# Patient Record
Sex: Female | Born: 1955
Health system: Southern US, Community
[De-identification: ages and names within clinical notes are randomized; demographics above are authoritative.]

## PROBLEM LIST (undated history)

## (undated) DIAGNOSIS — R112 Nausea with vomiting, unspecified: Secondary | ICD-10-CM

## (undated) DIAGNOSIS — G4730 Sleep apnea, unspecified: Secondary | ICD-10-CM

## (undated) DIAGNOSIS — E7800 Pure hypercholesterolemia, unspecified: Secondary | ICD-10-CM

## (undated) DIAGNOSIS — M1990 Unspecified osteoarthritis, unspecified site: Secondary | ICD-10-CM

## (undated) DIAGNOSIS — K219 Gastro-esophageal reflux disease without esophagitis: Secondary | ICD-10-CM

## (undated) DIAGNOSIS — Z8669 Personal history of other diseases of the nervous system and sense organs: Secondary | ICD-10-CM

## (undated) DIAGNOSIS — J069 Acute upper respiratory infection, unspecified: Secondary | ICD-10-CM

## (undated) DIAGNOSIS — I10 Essential (primary) hypertension: Secondary | ICD-10-CM

## (undated) DIAGNOSIS — Z8719 Personal history of other diseases of the digestive system: Secondary | ICD-10-CM

## (undated) DIAGNOSIS — R51 Headache: Secondary | ICD-10-CM

## (undated) DIAGNOSIS — C801 Malignant (primary) neoplasm, unspecified: Secondary | ICD-10-CM

## (undated) DIAGNOSIS — I209 Angina pectoris, unspecified: Secondary | ICD-10-CM

## (undated) DIAGNOSIS — Z9889 Other specified postprocedural states: Secondary | ICD-10-CM

## (undated) HISTORY — PX: HAMMER TOE SURGERY: SHX385

## (undated) HISTORY — PX: CHOLECYSTECTOMY: SHX55

## (undated) HISTORY — PX: CARPAL TUNNEL RELEASE: SHX101

## (undated) HISTORY — PX: BUNIONECTOMY: SHX129

---

## 1997-01-30 HISTORY — PX: APPENDECTOMY: SHX54

## 1997-01-30 HISTORY — PX: ABDOMINAL HYSTERECTOMY: SHX81

## 2008-11-05 DIAGNOSIS — I10 Essential (primary) hypertension: Secondary | ICD-10-CM | POA: Insufficient documentation

## 2008-11-05 DIAGNOSIS — E669 Obesity, unspecified: Secondary | ICD-10-CM | POA: Insufficient documentation

## 2008-11-05 DIAGNOSIS — G4733 Obstructive sleep apnea (adult) (pediatric): Secondary | ICD-10-CM | POA: Insufficient documentation

## 2010-07-31 DIAGNOSIS — C801 Malignant (primary) neoplasm, unspecified: Secondary | ICD-10-CM

## 2010-07-31 HISTORY — DX: Malignant (primary) neoplasm, unspecified: C80.1

## 2010-08-29 ENCOUNTER — Other Ambulatory Visit: Payer: Self-pay | Admitting: Obstetrics and Gynecology

## 2010-08-29 DIAGNOSIS — C50912 Malignant neoplasm of unspecified site of left female breast: Secondary | ICD-10-CM

## 2010-08-31 HISTORY — PX: BREAST SURGERY: SHX581

## 2010-09-02 ENCOUNTER — Ambulatory Visit
Admission: RE | Admit: 2010-09-02 | Discharge: 2010-09-02 | Disposition: A | Discharge status: Home / Self Care | Payer: BC Managed Care – PPO | Source: Ambulatory Visit | Attending: Obstetrics and Gynecology | Admitting: Obstetrics and Gynecology

## 2010-09-02 DIAGNOSIS — C50912 Malignant neoplasm of unspecified site of left female breast: Secondary | ICD-10-CM

## 2010-09-02 MED ORDER — GADOBENATE DIMEGLUMINE 529 MG/ML IV SOLN
20.0000 mL | Freq: Once | INTRAVENOUS | Status: AC | PRN
  Administered 2010-09-02: 20 mL via INTRAVENOUS

## 2010-12-01 ENCOUNTER — Encounter (HOSPITAL_COMMUNITY): Payer: Self-pay | Admitting: Respiratory Therapy

## 2010-12-05 ENCOUNTER — Ambulatory Visit (HOSPITAL_COMMUNITY)
Admission: RE | Admit: 2010-12-05 | Discharge: 2010-12-05 | Disposition: A | Discharge status: Home / Self Care | Payer: BC Managed Care – PPO | Source: Ambulatory Visit | Attending: Plastic Surgery | Admitting: Plastic Surgery

## 2010-12-05 ENCOUNTER — Encounter (HOSPITAL_COMMUNITY): Payer: Self-pay

## 2010-12-05 ENCOUNTER — Other Ambulatory Visit: Payer: Self-pay

## 2010-12-05 ENCOUNTER — Encounter (HOSPITAL_COMMUNITY)
Admission: RE | Admit: 2010-12-05 | Discharge: 2010-12-05 | Disposition: A | Discharge status: Home / Self Care | Payer: BC Managed Care – PPO | Source: Ambulatory Visit | Attending: Plastic Surgery | Admitting: Plastic Surgery

## 2010-12-05 DIAGNOSIS — J3489 Other specified disorders of nose and nasal sinuses: Secondary | ICD-10-CM | POA: Insufficient documentation

## 2010-12-05 DIAGNOSIS — I10 Essential (primary) hypertension: Secondary | ICD-10-CM | POA: Insufficient documentation

## 2010-12-05 DIAGNOSIS — Z01812 Encounter for preprocedural laboratory examination: Secondary | ICD-10-CM | POA: Insufficient documentation

## 2010-12-05 DIAGNOSIS — Z01818 Encounter for other preprocedural examination: Secondary | ICD-10-CM | POA: Insufficient documentation

## 2010-12-05 HISTORY — DX: Unspecified osteoarthritis, unspecified site: M19.90

## 2010-12-05 HISTORY — DX: Essential (primary) hypertension: I10

## 2010-12-05 HISTORY — DX: Sleep apnea, unspecified: G47.30

## 2010-12-05 HISTORY — DX: Malignant (primary) neoplasm, unspecified: C80.1

## 2010-12-05 LAB — SURGICAL PCR SCREEN
MRSA, PCR: NEGATIVE
Staphylococcus aureus: NEGATIVE

## 2010-12-05 LAB — BASIC METABOLIC PANEL
Calcium: 9.8 mg/dL (ref 8.4–10.5)
Creatinine, Ser: 0.6 mg/dL (ref 0.50–1.10)
GFR calc Af Amer: >90 mL/min (ref >90)
Sodium: 138 mEq/L (ref 135–145)

## 2010-12-05 LAB — CBC
MCH: 28.7 pg (ref 26.0–34.0)
MCV: 85.7 fL (ref 78.0–100.0)
Platelets: 241 10*3/uL (ref 150–400)
RBC: 4.56 MIL/uL (ref 3.87–5.11)
RDW: 13.5 % (ref 11.5–15.5)
WBC: 7.2 10*3/uL (ref 4.0–10.5)

## 2010-12-05 NOTE — Pre-Procedure Instructions (Signed)
20 Joanna [name]  12/05/2010   Your procedure is scheduled on:  12/13/10  Report to [facility] at 900 AM.  Call this number if you have problems the morning of surgery: [phone]   Remember:   Do not eat food:After Midnight.  Do not drink clear liquids: 4 Hours before arrival.  Take these medicines the morning of surgery with A SIP OF WATER: none   Do not wear jewelry, make-up or nail polish.  Do not wear lotions, powders, or perfumes. You may wear deodorant.  Do not shave 48 hours prior to surgery.  Do not bring valuables to the hospital.  Contacts, dentures or bridgework may not be worn into surgery.  Leave suitcase in the car. After surgery it may be brought to your room.  For patients admitted to the hospital, checkout time is 11:00 AM the day of discharge.   Patients discharged the day of surgery will not be allowed to drive home.  Name and phone number of your driver: family  Special Instructions: CHG Shower Use Special Wash: 1/2 bottle night before surgery and 1/2 bottle morning of surgery.   Please read over the following fact sheets that you were given: MRSA Information

## 2010-12-12 MED ORDER — CEFAZOLIN SODIUM-DEXTROSE 2-3 GM-% IV SOLR
2.0000 g | INTRAVENOUS | Status: AC
  Administered 2010-12-13: 2 g via INTRAVENOUS
  Administered 2010-12-13: 1 g via INTRAVENOUS
  Filled 2010-12-12: qty 50

## 2010-12-12 MED ORDER — ENOXAPARIN SODIUM 40 MG/0.4ML ~~LOC~~ SOLN
40.0000 mg | Freq: Once | SUBCUTANEOUS | Status: AC
  Administered 2010-12-13: 40 mg via SUBCUTANEOUS
  Filled 2010-12-12: qty 0.4

## 2010-12-12 NOTE — Progress Notes (Signed)
Copy of patient's 08/30/10 Exercise Stress Echo report from [facility] is on her pre-operative chart.  Results indicate "Overall it is a negative test for exercise induced ischemia."

## 2010-12-13 ENCOUNTER — Encounter (HOSPITAL_COMMUNITY): Payer: Self-pay | Admitting: Vascular Surgery

## 2010-12-13 ENCOUNTER — Encounter (HOSPITAL_COMMUNITY): Payer: Self-pay | Admitting: *Deleted

## 2010-12-13 ENCOUNTER — Other Ambulatory Visit: Payer: Self-pay | Admitting: Plastic Surgery

## 2010-12-13 ENCOUNTER — Inpatient Hospital Stay (HOSPITAL_COMMUNITY)
Admission: RE | Admit: 2010-12-13 | Discharge: 2010-12-15 | DRG: 564 | Disposition: A | Discharge status: Home / Self Care | Payer: BC Managed Care – PPO | Source: Ambulatory Visit | Attending: Plastic Surgery | Admitting: Plastic Surgery

## 2010-12-13 ENCOUNTER — Encounter (HOSPITAL_COMMUNITY)
Admission: RE | Disposition: A | Discharge status: Home / Self Care | Payer: Self-pay | Source: Ambulatory Visit | Attending: Plastic Surgery

## 2010-12-13 ENCOUNTER — Inpatient Hospital Stay (HOSPITAL_COMMUNITY): Payer: BC Managed Care – PPO | Admitting: Vascular Surgery

## 2010-12-13 ENCOUNTER — Encounter (HOSPITAL_COMMUNITY): Payer: Self-pay | Admitting: Plastic Surgery

## 2010-12-13 DIAGNOSIS — Y838 Other surgical procedures as the cause of abnormal reaction of the patient, or of later complication, without mention of misadventure at the time of the procedure: Secondary | ICD-10-CM | POA: Diagnosis present

## 2010-12-13 DIAGNOSIS — G4730 Sleep apnea, unspecified: Secondary | ICD-10-CM | POA: Diagnosis present

## 2010-12-13 DIAGNOSIS — IMO0002 Reserved for concepts with insufficient information to code with codable children: Secondary | ICD-10-CM | POA: Diagnosis present

## 2010-12-13 DIAGNOSIS — C50919 Malignant neoplasm of unspecified site of unspecified female breast: Principal | ICD-10-CM

## 2010-12-13 DIAGNOSIS — I10 Essential (primary) hypertension: Secondary | ICD-10-CM | POA: Diagnosis present

## 2010-12-13 DIAGNOSIS — Z79899 Other long term (current) drug therapy: Secondary | ICD-10-CM

## 2010-12-13 HISTORY — DX: Gastro-esophageal reflux disease without esophagitis: K21.9

## 2010-12-13 HISTORY — PX: LATISSIMUS FLAP TO BREAST: SHX5357

## 2010-12-13 HISTORY — DX: Angina pectoris, unspecified: I20.9

## 2010-12-13 HISTORY — DX: Headache: R51

## 2010-12-13 LAB — GLUCOSE, CAPILLARY: Glucose-Capillary: 139 mg/dL — ABNORMAL HIGH (ref 70–99)

## 2010-12-13 SURGERY — RECONSTRUCTION, BREAST, USING LATISSIMUS DORSI MYOCUTANEOUS FLAP
Anesthesia: General | Laterality: Left | Wound class: Clean

## 2010-12-13 MED ORDER — LACTATED RINGERS IV SOLN
INTRAVENOUS | Status: DC
  Administered 2010-12-13 (×4): via INTRAVENOUS

## 2010-12-13 MED ORDER — FENTANYL CITRATE 0.05 MG/ML IJ SOLN
INTRAMUSCULAR | Status: DC | PRN
  Administered 2010-12-13: 50 ug via INTRAVENOUS
  Administered 2010-12-13: 100 ug via INTRAVENOUS
  Administered 2010-12-13 (×7): 50 ug via INTRAVENOUS
  Administered 2010-12-13: 25 ug via INTRAVENOUS
  Administered 2010-12-13: 50 ug via INTRAVENOUS
  Administered 2010-12-13: 100 ug via INTRAVENOUS
  Administered 2010-12-13: 25 ug via INTRAVENOUS
  Administered 2010-12-13: 50 ug via INTRAVENOUS

## 2010-12-13 MED ORDER — NEOSTIGMINE METHYLSULFATE 1 MG/ML IJ SOLN
INTRAMUSCULAR | Status: DC | PRN
  Administered 2010-12-13: 4 mg via INTRAVENOUS

## 2010-12-13 MED ORDER — MORPHINE SULFATE 2 MG/ML IJ SOLN: 0.0500 mg/kg | INTRAMUSCULAR | Status: DC | PRN

## 2010-12-13 MED ORDER — TRAZODONE HCL 100 MG PO TABS
100.0000 mg | ORAL_TABLET | Freq: Every day | ORAL | Status: DC
  Administered 2010-12-13 – 2010-12-14 (×2): 100 mg via ORAL
  Filled 2010-12-13 (×4): qty 1

## 2010-12-13 MED ORDER — CEFAZOLIN SODIUM 1-5 GM-% IV SOLN
1.0000 g | Freq: Three times a day (TID) | INTRAVENOUS | Status: DC
  Administered 2010-12-13 – 2010-12-15 (×6): 1 g via INTRAVENOUS
  Filled 2010-12-13 (×8): qty 50

## 2010-12-13 MED ORDER — SODIUM CHLORIDE 0.9 % IJ SOLN: 9.0000 mL | INTRAMUSCULAR | Status: DC | PRN

## 2010-12-13 MED ORDER — HYDROMORPHONE 0.3 MG/ML IV SOLN
INTRAVENOUS | Status: DC
  Administered 2010-12-13: 7.5 mg via INTRAVENOUS
  Administered 2010-12-14: 1.19 mg via INTRAVENOUS
  Administered 2010-12-14: 0.4 mg via INTRAVENOUS
  Administered 2010-12-14: 0.199 mg via INTRAVENOUS
  Administered 2010-12-14: 2 mg via INTRAVENOUS
  Administered 2010-12-14: 2.3 mg via INTRAVENOUS
  Administered 2010-12-15: 6 mL via INTRAVENOUS
  Administered 2010-12-15: 0.67 mL via INTRAVENOUS
  Administered 2010-12-15: 3.33 mL via INTRAVENOUS
  Administered 2010-12-15 (×2): 0.2 mg via INTRAVENOUS

## 2010-12-13 MED ORDER — VITAMIN D3 25 MCG (1000 UNIT) PO TABS
1000.0000 [IU] | ORAL_TABLET | Freq: Every day | ORAL | Status: DC
  Administered 2010-12-13 – 2010-12-15 (×3): 1000 [IU] via ORAL
  Filled 2010-12-13 (×4): qty 1

## 2010-12-13 MED ORDER — PROMETHAZINE HCL 25 MG/ML IJ SOLN: 12.5000 mg | Freq: Four times a day (QID) | INTRAMUSCULAR | Status: DC | PRN

## 2010-12-13 MED ORDER — HYDROCHLOROTHIAZIDE 25 MG PO TABS
12.5000 mg | ORAL_TABLET | Freq: Every day | ORAL | Status: DC
  Administered 2010-12-13: 12.5 mg via ORAL
  Filled 2010-12-13 (×5): qty 0.5

## 2010-12-13 MED ORDER — MEPERIDINE HCL 25 MG/ML IJ SOLN: 6.2500 mg | INTRAMUSCULAR | Status: DC | PRN

## 2010-12-13 MED ORDER — ONDANSETRON HCL 4 MG/2ML IJ SOLN: 4.0000 mg | Freq: Once | INTRAMUSCULAR | Status: DC | PRN

## 2010-12-13 MED ORDER — SCOPOLAMINE 1 MG/3DAYS TD PT72
1.0000 | MEDICATED_PATCH | TRANSDERMAL | Status: DC
  Administered 2010-12-13: 1 via TRANSDERMAL
  Filled 2010-12-13: qty 1

## 2010-12-13 MED ORDER — VECURONIUM BROMIDE 10 MG IV SOLR
INTRAVENOUS | Status: DC | PRN
  Administered 2010-12-13: 5 mg via INTRAVENOUS
  Administered 2010-12-13 (×3): 2 mg via INTRAVENOUS
  Administered 2010-12-13: 3 mg via INTRAVENOUS
  Administered 2010-12-13: 1 mg via INTRAVENOUS
  Administered 2010-12-13: 2 mg via INTRAVENOUS
  Administered 2010-12-13: 1 mg via INTRAVENOUS

## 2010-12-13 MED ORDER — ONDANSETRON HCL 4 MG/2ML IJ SOLN
INTRAMUSCULAR | Status: DC | PRN
  Administered 2010-12-13 (×2): 4 mg via INTRAVENOUS

## 2010-12-13 MED ORDER — DEXTROSE-NACL 5-0.45 % IV SOLN
INTRAVENOUS | Status: DC
Start: 2010-12-13 — End: 2010-12-15
  Administered 2010-12-13 – 2010-12-15 (×4): via INTRAVENOUS

## 2010-12-13 MED ORDER — PHENYLEPHRINE HCL 10 MG/ML IJ SOLN
INTRAMUSCULAR | Status: DC | PRN
  Administered 2010-12-13 (×2): 40 ug via INTRAVENOUS

## 2010-12-13 MED ORDER — HYDROMORPHONE HCL PF 1 MG/ML IJ SOLN
0.2500 mg | INTRAMUSCULAR | Status: DC | PRN
  Administered 2010-12-13 (×2): 0.5 mg via INTRAVENOUS

## 2010-12-13 MED ORDER — MIDAZOLAM HCL 5 MG/5ML IJ SOLN
INTRAMUSCULAR | Status: DC | PRN
  Administered 2010-12-13: 2 mg via INTRAVENOUS

## 2010-12-13 MED ORDER — SODIUM CHLORIDE 0.9 % IR SOLN
Status: DC | PRN
  Administered 2010-12-13: 2
  Administered 2010-12-13: 1

## 2010-12-13 MED ORDER — DEXAMETHASONE SODIUM PHOSPHATE 4 MG/ML IJ SOLN
INTRAMUSCULAR | Status: DC | PRN
  Administered 2010-12-13: 4 mg via INTRAVENOUS

## 2010-12-13 MED ORDER — PROMETHAZINE HCL 25 MG/ML IJ SOLN
INTRAMUSCULAR | Status: AC
  Filled 2010-12-13: qty 1

## 2010-12-13 MED ORDER — RALOXIFENE HCL 60 MG PO TABS
60.0000 mg | ORAL_TABLET | Freq: Every day | ORAL | Status: DC
  Administered 2010-12-14 – 2010-12-15 (×2): 60 mg via ORAL
  Filled 2010-12-13 (×2): qty 1

## 2010-12-13 MED ORDER — ONDANSETRON HCL 4 MG/2ML IJ SOLN
4.0000 mg | Freq: Four times a day (QID) | INTRAMUSCULAR | Status: DC | PRN
  Administered 2010-12-14: 4 mg via INTRAVENOUS
  Filled 2010-12-13: qty 2

## 2010-12-13 MED ORDER — GLYCOPYRROLATE 0.2 MG/ML IJ SOLN
INTRAMUSCULAR | Status: DC | PRN
  Administered 2010-12-13: .6 mg via INTRAVENOUS

## 2010-12-13 MED ORDER — DROPERIDOL 2.5 MG/ML IJ SOLN
INTRAMUSCULAR | Status: AC
  Administered 2010-12-13: 0.625 mg via INTRAVENOUS
  Filled 2010-12-13: qty 2

## 2010-12-13 MED ORDER — LISINOPRIL 20 MG PO TABS
20.0000 mg | ORAL_TABLET | Freq: Every day | ORAL | Status: DC
  Administered 2010-12-13 – 2010-12-15 (×3): 20 mg via ORAL
  Filled 2010-12-13 (×4): qty 1

## 2010-12-13 MED ORDER — ROCURONIUM BROMIDE 100 MG/10ML IV SOLN
INTRAVENOUS | Status: DC | PRN
  Administered 2010-12-13: 50 mg via INTRAVENOUS

## 2010-12-13 MED ORDER — ENOXAPARIN SODIUM 40 MG/0.4ML ~~LOC~~ SOLN
40.0000 mg | SUBCUTANEOUS | Status: DC
  Filled 2010-12-13: qty 0.4

## 2010-12-13 MED ORDER — DIPHENHYDRAMINE HCL 50 MG/ML IJ SOLN: 12.5000 mg | Freq: Four times a day (QID) | INTRAMUSCULAR | Status: DC | PRN

## 2010-12-13 MED ORDER — SODIUM CHLORIDE 0.9 % IR SOLN
Status: DC | PRN
  Administered 2010-12-13: 14:00:00

## 2010-12-13 MED ORDER — PROPOFOL 10 MG/ML IV EMUL
INTRAVENOUS | Status: DC | PRN
  Administered 2010-12-13: 200 mg via INTRAVENOUS

## 2010-12-13 MED ORDER — LISINOPRIL-HYDROCHLOROTHIAZIDE 20-12.5 MG PO TABS: 1.0000 | ORAL_TABLET | Freq: Every day | ORAL | Status: DC

## 2010-12-13 MED ORDER — DIPHENHYDRAMINE HCL 12.5 MG/5ML PO ELIX
12.5000 mg | ORAL_SOLUTION | Freq: Four times a day (QID) | ORAL | Status: DC | PRN
  Filled 2010-12-13: qty 5

## 2010-12-13 MED ORDER — METHOCARBAMOL 500 MG PO TABS
500.0000 mg | ORAL_TABLET | Freq: Four times a day (QID) | ORAL | Status: DC | PRN
  Filled 2010-12-13: qty 1

## 2010-12-13 MED ORDER — NALOXONE HCL 0.4 MG/ML IJ SOLN: 0.4000 mg | INTRAMUSCULAR | Status: DC | PRN

## 2010-12-13 MED ORDER — DROPERIDOL 2.5 MG/ML IJ SOLN
0.6250 mg | Freq: Once | INTRAMUSCULAR | Status: AC
  Administered 2010-12-13: 0.625 mg via INTRAVENOUS

## 2010-12-13 SURGICAL SUPPLY — 77 items
APPLIER CLIP 9.375 MED OPEN (MISCELLANEOUS) ×2
ATCH SMKEVC FLXB CAUT HNDSWH (FILTER) ×1 IMPLANT
BAG DECANTER FOR FLEXI CONT (MISCELLANEOUS) ×4 IMPLANT
BIOPATCH RED 1 DISK 7.0 (GAUZE/BANDAGES/DRESSINGS) ×4 IMPLANT
BIOPATCH WHT 1IN DISK W/4.0 H (GAUZE/BANDAGES/DRESSINGS) ×2 IMPLANT
BLADE SURG 10 STRL SS (BLADE) ×2 IMPLANT
BLADE SURG 15 STRL LF DISP TIS (BLADE) ×1 IMPLANT
BLADE SURG 15 STRL SS (BLADE) ×1
BNDG COHESIVE 4X5 TAN STRL (GAUZE/BANDAGES/DRESSINGS) IMPLANT
BNDG ELASTIC 6X15 VLCR STRL LF (GAUZE/BANDAGES/DRESSINGS) ×2 IMPLANT
CANISTER SUCTION 2500CC (MISCELLANEOUS) ×2 IMPLANT
CHLORAPREP W/TINT 26ML (MISCELLANEOUS) ×4 IMPLANT
CLIP APPLIE 9.375 MED OPEN (MISCELLANEOUS) ×1 IMPLANT
CLOTH BEACON ORANGE TIMEOUT ST (SAFETY) ×2 IMPLANT
CONT SPEC 4OZ CLIKSEAL STRL BL (MISCELLANEOUS) ×2 IMPLANT
COVER SURGICAL LIGHT HANDLE (MISCELLANEOUS) ×2 IMPLANT
DERMABOND ADVANCED (GAUZE/BANDAGES/DRESSINGS) ×2
DERMABOND ADVANCED .7 DNX12 (GAUZE/BANDAGES/DRESSINGS) ×2 IMPLANT
DRAIN CHANNEL 19F RND (DRAIN) ×6 IMPLANT
DRAPE CHEST BREAST 15X10 FENES (DRAPES) ×4 IMPLANT
DRAPE INCISE 13X13 STRL (DRAPES) ×2 IMPLANT
DRAPE INCISE 23X17 IOBAN STRL (DRAPES) ×1
DRAPE INCISE IOBAN 23X17 STRL (DRAPES) ×1 IMPLANT
DRAPE ORTHO SPLIT 77X108 STRL (DRAPES) ×2
DRAPE PROXIMA HALF (DRAPES) ×4 IMPLANT
DRAPE SURG ORHT 6 SPLT 77X108 (DRAPES) ×2 IMPLANT
DRAPE WARM FLUID 44X44 (DRAPE) ×2 IMPLANT
DRSG PAD ABDOMINAL 8X10 ST (GAUZE/BANDAGES/DRESSINGS) ×4 IMPLANT
ELECT BLADE 6.5 EXT (BLADE) ×2 IMPLANT
ELECT CAUTERY BLADE 6.4 (BLADE) ×2 IMPLANT
ELECT REM PT RETURN 9FT ADLT (ELECTROSURGICAL) ×2
ELECTRODE REM PT RTRN 9FT ADLT (ELECTROSURGICAL) ×1 IMPLANT
EVACUATOR SILICONE 100CC (DRAIN) ×4 IMPLANT
EVACUATOR SMOKE ACCUVAC VALLEY (FILTER) ×1
GAUZE XEROFORM 5X9 LF (GAUZE/BANDAGES/DRESSINGS) IMPLANT
GLOVE BIO SURGEON STRL SZ7.5 (GLOVE) ×6 IMPLANT
GLOVE BIO SURGEON STRL SZ8 (GLOVE) IMPLANT
GLOVE BIOGEL PI IND STRL 7.0 (GLOVE) ×2 IMPLANT
GLOVE BIOGEL PI IND STRL 8 (GLOVE) ×3 IMPLANT
GLOVE BIOGEL PI INDICATOR 7.0 (GLOVE) ×2
GLOVE BIOGEL PI INDICATOR 8 (GLOVE) ×3
GLOVE ECLIPSE 6.5 STRL STRAW (GLOVE) ×2 IMPLANT
GLOVE SURG SS PI 6.5 STRL IVOR (GLOVE) ×2 IMPLANT
GOWN PREVENTION PLUS XLARGE (GOWN DISPOSABLE) ×6 IMPLANT
GOWN STRL NON-REIN LRG LVL3 (GOWN DISPOSABLE) ×8 IMPLANT
KIT BASIN OR (CUSTOM PROCEDURE TRAY) ×2 IMPLANT
KIT ROOM TURNOVER OR (KITS) ×2 IMPLANT
Mentor Saline Breast Implant with Diaphragm Valve (Breast) ×2 IMPLANT
NS IRRIG 1000ML POUR BTL (IV SOLUTION) IMPLANT
PACK GENERAL/GYN (CUSTOM PROCEDURE TRAY) ×2 IMPLANT
PAD ARMBOARD 7.5X6 YLW CONV (MISCELLANEOUS) ×8 IMPLANT
PENCIL BUTTON HOLSTER BLD 10FT (ELECTRODE) ×2 IMPLANT
PREFILTER EVAC NS 1 1/3-3/8IN (MISCELLANEOUS) ×2 IMPLANT
SET COLLECT BLD 21X3/4 12 PB (MISCELLANEOUS) IMPLANT
SPECIMEN JAR LARGE (MISCELLANEOUS) ×2 IMPLANT
SPONGE GAUZE 4X4 12PLY (GAUZE/BANDAGES/DRESSINGS) ×2 IMPLANT
SPONGE GAUZE 4X4 STERILE 39 (GAUZE/BANDAGES/DRESSINGS) ×2 IMPLANT
SPONGE LAP 18X18 X RAY DECT (DISPOSABLE) IMPLANT
STAPLER VISISTAT 35W (STAPLE) ×2 IMPLANT
STOCKINETTE IMPERVIOUS 9X36 MD (GAUZE/BANDAGES/DRESSINGS) IMPLANT
STOPCOCK 4 WAY LG BORE MALE ST (IV SETS) IMPLANT
STRIP CLOSURE SKIN 1/2X4 (GAUZE/BANDAGES/DRESSINGS) IMPLANT
SUT MNCRL AB 3-0 PS2 18 (SUTURE) ×10 IMPLANT
SUT MON AB 2-0 CT1 36 (SUTURE) IMPLANT
SUT PDS AB 0 CT 36 (SUTURE) ×4 IMPLANT
SUT PROLENE 2 0 FS (SUTURE) ×4 IMPLANT
SUT PROLENE 3 0 PS 1 (SUTURE) ×4 IMPLANT
SUT VIC AB 3-0 FS2 27 (SUTURE) IMPLANT
SUT VIC AB 3-0 SH 8-18 (SUTURE) ×6 IMPLANT
SYR 50ML SLIP (SYRINGE) IMPLANT
SYR BULB IRRIGATION 50ML (SYRINGE) ×2 IMPLANT
TAPE CLOTH SURG 6X10 WHT LF (GAUZE/BANDAGES/DRESSINGS) ×2 IMPLANT
TOWEL OR 17X24 6PK STRL BLUE (TOWEL DISPOSABLE) ×2 IMPLANT
TOWEL OR 17X26 10 PK STRL BLUE (TOWEL DISPOSABLE) ×4 IMPLANT
TRAY FOLEY CATH 14FRSI W/METER (CATHETERS) IMPLANT
TUBE CONNECTING 12X1/4 (SUCTIONS) ×2 IMPLANT
WATER STERILE IRR 1000ML POUR (IV SOLUTION) IMPLANT

## 2010-12-13 NOTE — H&P (Signed)
Patient seen and reevaluated and reexamined and there are no changes.  See Office Note dated 31 October. It will be scanned into the system.

## 2010-12-13 NOTE — Brief Op Note (Signed)
12/13/2010  6:48 PM  PATIENT:  Joanna [name]  55 y.o. female  PRE-OPERATIVE DIAGNOSIS:  LEFT BREAST CANCER  POST-OPERATIVE DIAGNOSIS:  Left Breast Cancer  PROCEDURE:  Procedure(s): LATISSIMUS FLAP TO BREAST  SURGEON:  Surgeon(s): Rossie Muskrat  PHYSICIAN ASSISTANT:   ASSISTANTS: none   ANESTHESIA:   general  EBL:  Total I/O In: 3000 [I.V.:3000] Out: 875 [Urine:625; Blood:250]  BLOOD ADMINISTERED:none  DRAINS: (Three) Jackson-Pratt drain(s) with closed bulb suction in the left chest and left back donor site.   LOCAL MEDICATIONS USED:  NONE  SPECIMEN:  Source of Specimen:  Left chest mastectomy scar and left encapsulated seroma/hematoma from previous mastectomy.  DISPOSITION OF SPECIMEN:  PATHOLOGY  COUNTS:  YES  TOURNIQUET:  * No tourniquets in log *  DICTATION: .Other Dictation: Dictation Number [id number]  PLAN OF CARE: Admit to inpatient   PATIENT DISPOSITION:  PACU - hemodynamically stable.   Delay start of Pharmacological VTE agent (>24hrs) due to surgical blood loss or risk of bleeding:  {YES

## 2010-12-13 NOTE — Anesthesia Procedure Notes (Addendum)
Procedure Name: Intubation Date/Time: 12/13/2010 12:50 PM Performed by: Elon Alas Pre-anesthesia Checklist: Patient identified, Timeout performed, Emergency Drugs available, Suction available and Patient being monitored Patient Re-evaluated:Patient Re-evaluated prior to inductionOxygen Delivery Method: Circle System Utilized Preoxygenation: Pre-oxygenation with 100% oxygen Intubation Type: IV induction Ventilation: Oral airway inserted - appropriate to patient size and Mask ventilation without difficulty Laryngoscope Size: Mac and 3 Grade View: Grade IV Tube size: 7.0 mm Airway Equipment and Method: video-laryngoscopy Placement Confirmation: positive ETCO2,  ETT inserted through vocal cords under direct vision and breath sounds checked- equal and bilateral Secured at: 21 cm Tube secured with: Tape Dental Injury: Teeth and Oropharynx as per pre-operative assessment  Comments: DLx1 with MAC 4 blade, grade 4 view.  DL x1 with glidescope.  Grade 1 view.  7.0ETT inserted atraumatically.

## 2010-12-13 NOTE — Preoperative (Signed)
Beta Blockers 2  Reason not to administer Beta Blockers:Not Applicable 

## 2010-12-13 NOTE — Anesthesia Preprocedure Evaluation (Addendum)
Anesthesia Evaluation  Patient identified by MRN, date of birth, ID band Patient awake    Reviewed: Allergy & Precautions, H&P , NPO status , Patient's Chart, lab work & pertinent test results  Airway Mallampati: II TM Distance: >3 FB Neck ROM: Full    Dental  (+) Teeth Intact and Dental Advisory Given   Pulmonary sleep apnea (Inconsistent use of cpap at home) and Continuous Positive Airway Pressure Ventilation ,    Pulmonary exam normal       Cardiovascular hypertension, Pt. on medications     Neuro/Psych    GI/Hepatic   Endo/Other  Morbid obesity  Renal/GU      Musculoskeletal   Abdominal   Peds  Hematology   Anesthesia Other Findings   Reproductive/Obstetrics                        Anesthesia Physical Anesthesia Plan  ASA: III  Anesthesia Plan: General   Post-op Pain Management:    Induction: Intravenous  Airway Management Planned: Oral ETT  Additional Equipment:   Intra-op Plan:   Post-operative Plan: Extubation in OR  Informed Consent: I have reviewed the patients History and Physical, chart, labs and discussed the procedure including the risks, benefits and alternatives for the proposed anesthesia with the patient or authorized representative who has indicated his/her understanding and acceptance.   Dental advisory given  Plan Discussed with: CRNA and Surgeon  Anesthesia Plan Comments:         Anesthesia Quick Evaluation

## 2010-12-13 NOTE — Transfer of Care (Signed)
Immediate Anesthesia Transfer of Care Note  Patient: Joanna [name]  Procedure(s) Performed:  LATISSIMUS FLAP TO BREAST - LEFT LATISSIMUS FLAP WITH IMPLANT  Patient Location: PACU  Anesthesia Type: General  Level of Consciousness: awake, alert  and oriented  Airway & Oxygen Therapy: Patient Spontanous Breathing and Patient connected to nasal cannula oxygen  Post-op Assessment: Report given to PACU RN, Post -op Vital signs reviewed and stable and Patient moving all extremities X 4  Post vital signs: Reviewed and stable  Complications: No apparent anesthesia complications

## 2010-12-14 ENCOUNTER — Encounter (HOSPITAL_COMMUNITY): Payer: Self-pay | Admitting: General Practice

## 2010-12-14 DIAGNOSIS — C50919 Malignant neoplasm of unspecified site of unspecified female breast: Secondary | ICD-10-CM

## 2010-12-14 MED ORDER — DOCUSATE SODIUM 100 MG PO CAPS
100.0000 mg | ORAL_CAPSULE | Freq: Two times a day (BID) | ORAL | Status: DC
  Administered 2010-12-14 – 2010-12-15 (×3): 100 mg via ORAL
  Filled 2010-12-14 (×3): qty 1

## 2010-12-14 MED ORDER — MAGNESIUM HYDROXIDE 400 MG/5ML PO SUSP: 45.0000 mL | Freq: Every day | ORAL | Status: DC | PRN

## 2010-12-14 MED ORDER — ENOXAPARIN SODIUM 40 MG/0.4ML ~~LOC~~ SOLN
40.0000 mg | SUBCUTANEOUS | Status: DC
  Administered 2010-12-14 – 2010-12-15 (×2): 40 mg via SUBCUTANEOUS
  Filled 2010-12-14 (×3): qty 0.4

## 2010-12-14 NOTE — Op Note (Signed)
[name], [name]                 ACCOUNT NO.:  [id number]  MEDICAL RECORD NO.:  [id number]  LOCATION:  5127                         FACILITY:  [facility]  PHYSICIAN:  Etter Sjogren, M.D.     DATE OF BIRTH:  06-22-1955  DATE OF PROCEDURE:  12/13/2010 DATE OF DISCHARGE:                              OPERATIVE REPORT   PREOPERATIVE DIAGNOSIS:  Left breast cancer.  POSTOPERATIVE DIAGNOSES: 1. Left breast cancer. 2. Old incapsulated seroma/hematoma from previous mastectomy.  PROCEDURE PERFORMED: 1. Excision of an extensive hematoma/seroma with capsulectomy to the     left chest. 2. Left latissimus myocutaneous flap to left chest. 3. Delayed breast reconstruction with an implant.  SURGEON:  Etter Sjogren, MD  ANESTHESIA:  General.  ESTIMATED BLOOD LOSS:  100 mL.  DRAINS:  Three 19-French drains, 1 in the left chest, 2 in the left back donor site.  CLINICAL NOTE:  This 54 year old woman has breast cancer.  She has had mastectomy and presents for reconstruction.  Options were discussed and she selected a latissimus flap with implant.  She was fairly heavy and she did not qualify for a TRAM flap according to the weight guidelines. She did prefer the latissimus with implants and placement of tissue expander with implant.  She selected saline implants with silicone gel. The nature of the procedure and risks plus complications were discussed include not limited to bleeding, infection, anesthesia complications, healing problems, scarring, fluid accumulations, loss of sensation, displacement device, capsular contracture, wrinkles, ripples, failure of device, pneumothorax, pulmonary embolism, loss of flap, loss of portion of flap, asymmetry disappointment, and secondary procedures, and she understood all of this and wished to proceed.  PROCEDURE:  The patient was marked in the holding area for the latissimus flap.  She was taken to the operating room and placed supine. After satisfactory  general anesthesia, she was prepped with ChloraPrep after waiting a full 2 minutes to dry and she was draped with sterile drapes.  The old mastectomy scar was excised.  It was sent as a specimen.  The dissection was carried down through the subcutaneous tissue, and a large incapsulated seroma/hematoma cavity was identified and it was excised in it's entirety.  This was an extensive resection, thorough irrigation with saline and antibiotic solution.  No evidence of any purulence or infection, and then the space was developed for the recreating mastectomy defect and the pectoralis muscle was released along its lateral border and inferiorly in order to provide additional muscular coverage for the implant.  The antibiotic solution soaked laps were then placed.  The skin was closed with skin staples and a dry sterile drape from the 8M cotton was placed over this wound.  She was then turned right lateral decubitus and the back was prepped with ChloraPrep and draped with sterile drapes.  The draping was performed after waiting the full 3 minutes for drying time.  The skin paddle was incised and dissection carried into the subcutaneous tissue beveling superior and inferior in order to leave broader attachment at the level of the muscle that was present at the level of the skin.  The underlying muscles was identified.  The muscle was exposed at  its anterior border, posterior border, and inferiorly, and then it was released inferiorly and then gently dissected free in a cephalad direction, reflecting its superiorly perforating vessels were identified and were triple ligated with Ligaclips and divided occasionally a 3-0 Vicryl free tie was used as well.  The muscle flap was then reflected superiorly protecting the underlying thoracodorsal pedicle at all times. The flap was passed gently into the left anterior chest after removing the laps and made this transit very nicely through the  subcutaneous space.  Thorough irrigation of the donor site with saline and meticulous hemostasis was achieved with using electrocautery.  Two 19-French drains were positioned, brought through the separate stab wounds infero, anterior, inferiorly and secured with 3-0 Prolene sutures.  Meticulous hemostasis having been confirmed, the closure with 0 PDS interrupted inverted deep sutures, 2-0 Monocryl interrupted inverted deep dermal sutures, and 3-0 Monocryl interrupted deep dermal sutures, and some reinforcing 2-0 Prolene simple interrupted sutures.  Dermabond was then placed and dry sterile dressings, and the Hypafix tape was applied and she was then placed back into a supine position.  Once she was supine, the Steri-Drape from 43M cottony was removed and we stapled the wound.  Mastectomy site was prepped with Betadine and draped with sterile drapes.  The staples were removed.  The flap was inspected, had excellent color bright red bleeding all around its periphery consistent with viability.  Thorough irrigation with saline.  Hemostasis with electrocautery.  Excellent hemostasis confirms.  Antibiotic solution also placed.  The latissimus muscle was then inset inferiorly using 3-0 Vicryl interrupted horizontal mattress sutures.  Then, it was sewn to the suture to the lateral border of the pectoralis major muscle using 3-0 Vicryl interrupted horizontal mattress sutures.  A number of these were left and tied at the conclusion in order to allow placement of the implant.  Antibiotic solution was placed.  After thoroughly cleaning gloves, the implant was prepared, this was a Mentor maximum fill 480 mL saline, moderate profile implant, 100 cc sterile saline placed closed filling system.  The implant was soaked in antibiotic solution for greater than 5 minutes.  The implant was then filled with a MAC, was positioned in the submuscular space and then filled to the maximum 480 mL used to closed  filling system.  The running and 3-0 Vicryl sutures were tied securely after again irrigating with antibiotic solution.  The skin paddle looks very healthy.  Bright red bleeding around its periphery consistent with viability.  A 19-French drain was positioned, brought through a separate stab wound inferiorly and secured with 3-0 Prolene suture and the wound closure with 3-0 Monocryl inverted dermal sutures, and running 3-0 Monocryl subcuticular suture and Dermabond was applied.  She was transferred to recovery in stable and tolerated the procedure well.     Etter Sjogren, M.D.     DB/MEDQ  D:  12/13/2010  T:  12/13/2010  Job:  [id number]

## 2010-12-14 NOTE — Progress Notes (Signed)
Subjective: Sore but good pain control. Nausea has resolved. Tolerating liquids.  Objective: Vital signs in last 24 hours: Temp:  [97.4 F (36.3 C)-98.4 F (36.9 C)] 97.7 F (36.5 C) (11/14 0540) Pulse Rate:  [61-91] 75  (11/14 0540) Resp:  [13-19] 18  (11/14 0540) BP: (98-159)/(53-76) 98/58 mmHg (11/14 0540) SpO2:  [94 %-99 %] 97 % (11/14 0540) Weight:  [264 lb 8.8 oz (120 kg)] 264 lb 8.8 oz (120 kg) (11/13 2025)  Intake/Output from previous day: 11/13 0701 - 11/14 0700 In: 4375 [I.V.:4375] Out: 2635 [Urine:2225; Drains:160; Blood:250] Intake/Output this shift:    Operative sites: Mastectomy flaps viable. Flap has good color. Pink. No evidence of vascular compromise. Drains functioning. Drainage thin.  No results found for this basename: WBC:2,HGB:2,HCT:2,PLATELETS:2,NA:2,K:2,CL:2,CO2:2,BUN:2,CREATININE:2,GLU:2 in the last 72 hours  Studies/Results: No results found.  Assessment/Plan: D/C foley. Ambulate. Decrease IV. Advance diet.  LOS: 1 day    Josel Keo M 12/14/2010 8:47 AM

## 2010-12-14 NOTE — Anesthesia Postprocedure Evaluation (Signed)
  Anesthesia Post-op Note  Patient: Joanna [name]  Procedure(s) Performed:  LATISSIMUS FLAP TO BREAST - LEFT LATISSIMUS FLAP WITH IMPLANT  Patient Location: PACU  Anesthesia Type: General  Level of Consciousness: awake and alert   Airway and Oxygen Therapy: Patient Spontanous Breathing  Post-op Pain: mild  Post-op Assessment: Post-op Vital signs reviewed, Patient's Cardiovascular Status Stable, Respiratory Function Stable, Patent Airway, No signs of Nausea or vomiting and Pain level controlled  Post-op Vital Signs: Reviewed and stable  Complications: No apparent anesthesia complications

## 2010-12-15 MED ORDER — BACITRACIN-NEOMYCIN-POLYMYXIN 400-5-5000 EX OINT
TOPICAL_OINTMENT | CUTANEOUS | Status: AC
  Administered 2010-12-15: 15:00:00
  Filled 2010-12-15: qty 1

## 2010-12-15 MED ORDER — HYDROCHLOROTHIAZIDE 12.5 MG PO CAPS
12.5000 mg | ORAL_CAPSULE | Freq: Every day | ORAL | Status: DC
  Administered 2010-12-15: 12.5 mg via ORAL

## 2010-12-15 MED ORDER — HYDROMORPHONE HCL 2 MG PO TABS: 2.0000 mg | ORAL_TABLET | ORAL | Status: DC | PRN

## 2010-12-15 MED ORDER — DSS 100 MG PO CAPS: 100.0000 mg | ORAL_CAPSULE | Freq: Two times a day (BID) | ORAL | Status: AC

## 2010-12-15 MED ORDER — CEPHALEXIN 500 MG PO CAPS: 500.0000 mg | ORAL_CAPSULE | Freq: Four times a day (QID) | ORAL | Status: AC

## 2010-12-15 MED ORDER — ENOXAPARIN SODIUM 40 MG/0.4ML ~~LOC~~ SOLN: 40.0000 mg | SUBCUTANEOUS | Status: DC

## 2010-12-15 MED ORDER — HYDROMORPHONE HCL 2 MG PO TABS: ORAL_TABLET | ORAL | Status: DC

## 2010-12-15 MED ORDER — METHOCARBAMOL 500 MG PO TABS: 500.0000 mg | ORAL_TABLET | Freq: Four times a day (QID) | ORAL | Status: AC | PRN

## 2010-12-15 NOTE — Discharge Summary (Signed)
Physician Discharge Summary  Patient ID: Joanna [name] MRN: [id number] DOB/AGE: [birth year] [age] y.o.  Admit date: 12/13/2010 Discharge date: 12/15/2010   Discharge Diagnoses:  Active Problems:  Breast cancer   Discharged Condition: good  Hospital Course: On the day of admission the patient was taken to surgery and had left latissimus flap and saline implant breast reconstruction. The patient tolerated the procedures well. Postoperatively, the flap maintained excellent color and capillary refill. The patient was ambulatory and tolerating diet on the first postoperative day. Donor site fine. Drains functioning. Drainage thin. VTE prophylaxis begun.Tolerating diet. Second day pain well controlled. She asked to be discharged.  Treatments: antibiotics: Ancef, anticoagulation: LMW heparin and surgery: Latissimus flap with implant breast reconstruction left side.  Discharge Exam: Blood pressure 93/64, pulse 82, temperature 99.1 F (37.3 C), temperature source Oral, resp. rate 20, height 5\' 1"  (1.549 m), weight 264 lb 8.8 oz (120 kg), SpO2 94.00%.  Operative sites: Flap viable. Drains functioning. Drainage thin.  Disposition: discharge home.  Discharge Orders    Future Orders Please Complete By Expires   Diet - low sodium heart healthy      Discharge instructions      Comments:   Resume pre hospital medications except no aspirin or antiinflammatory drugs   May walk up steps      Increase activity slowly      Driving Restrictions      Comments:   No driving for four weeks   Leave dressing on - Keep it clean, dry, and intact until clinic visit        Current Discharge Medication List    START taking these medications   Details  cephALEXin (KEFLEX) 500 MG capsule Take 1 capsule (500 mg total) by mouth 4 (four) times daily. Qty: 40 capsule, Refills: 0    docusate sodium 100 MG CAPS Take 100 mg by mouth 2 (two) times daily. Qty: 30 capsule, Refills: 1    enoxaparin (LOVENOX) 40  MG/0.4ML SOLN Inject 0.4 mLs (40 mg total) into the skin daily. Qty: 11.2 mL, Refills: 0    HYDROmorphone (DILAUDID) 2 MG tablet Take 1 tablet (2 mg total) by mouth every 4 (four) hours as needed for pain. Qty: 70 tablet, Refills: 0    methocarbamol (ROBAXIN) 500 MG tablet Take 1 tablet (500 mg total) by mouth every 6 (six) hours as needed. Qty: 30 tablet, Refills: 1      CONTINUE these medications which have NOT CHANGED   Details  Calcium Carbonate-Vit D-Min (CALCIUM 1200 PO) Take 1 tablet by mouth daily.      cholecalciferol (VITAMIN D) 1000 UNITS tablet Take 1,000 Units by mouth daily.      lisinopril-hydrochlorothiazide (PRINZIDE,ZESTORETIC) 20-12.5 MG per tablet Take 1 tablet by mouth daily.      raloxifene (EVISTA) 60 MG tablet Take 60 mg by mouth daily.      traZODone (DESYREL) 100 MG tablet Take 100 mg by mouth at bedtime.         Follow-up Information    Follow up with Romen Yutzy M in 6 days.   Contact information:   [facility] [address] [place] [address] [phone]          Signed: Rossie Muskrat 12/15/2010, 3:03 PM

## 2010-12-27 ENCOUNTER — Encounter (HOSPITAL_COMMUNITY): Payer: Self-pay | Admitting: Plastic Surgery

## 2011-01-13 ENCOUNTER — Encounter (HOSPITAL_COMMUNITY): Payer: Self-pay | Admitting: Pharmacy Technician

## 2011-01-20 ENCOUNTER — Encounter (HOSPITAL_COMMUNITY): Payer: Self-pay

## 2011-01-20 ENCOUNTER — Other Ambulatory Visit (HOSPITAL_COMMUNITY): Payer: BC Managed Care – PPO

## 2011-01-20 ENCOUNTER — Encounter (HOSPITAL_COMMUNITY)
Admission: RE | Admit: 2011-01-20 | Discharge: 2011-01-20 | Disposition: A | Discharge status: Home / Self Care | Payer: BC Managed Care – PPO | Source: Ambulatory Visit | Attending: Plastic Surgery | Admitting: Plastic Surgery

## 2011-01-20 HISTORY — DX: Personal history of other diseases of the digestive system: Z87.19

## 2011-01-20 HISTORY — DX: Other specified postprocedural states: Z98.890

## 2011-01-20 HISTORY — DX: Nausea with vomiting, unspecified: R11.2

## 2011-01-20 HISTORY — DX: Acute upper respiratory infection, unspecified: J06.9

## 2011-01-20 LAB — BASIC METABOLIC PANEL
BUN: 9 mg/dL (ref 6–23)
Chloride: 101 mEq/L (ref 96–112)
Creatinine, Ser: 0.54 mg/dL (ref 0.50–1.10)
GFR calc Af Amer: >90 mL/min (ref >90)
GFR calc non Af Amer: >90 mL/min (ref >90)
Potassium: 4.3 mEq/L (ref 3.5–5.1)

## 2011-01-20 LAB — SURGICAL PCR SCREEN: Staphylococcus aureus: NEGATIVE

## 2011-01-20 LAB — CBC
HCT: 36 % (ref 36.0–46.0)
MCHC: 33.9 g/dL (ref 30.0–36.0)
Platelets: 244 10*3/uL (ref 150–400)
RDW: 13.5 % (ref 11.5–15.5)
WBC: 7.5 10*3/uL (ref 4.0–10.5)

## 2011-01-20 MED ORDER — CHLORHEXIDINE GLUCONATE 4 % EX LIQD: 1.0000 "application " | Freq: Once | CUTANEOUS | Status: DC

## 2011-01-20 NOTE — Progress Notes (Signed)
Spoke with Shonna Chock PA regarding recent sinus infection/cough-does patient need CXR? Revonda Standard stated we do not need- Patient was seen by Dr. Odis Luster 2 days ago and she states he said lungs were clear & she is on antibiotic/due to finish 12/25. Patient had CXR 12/05/10.    Sleep Study done about 3 yrs ago but MD office [facility] in [place]. [phone]) states those records are now off site and have to be requested by the patient.

## 2011-01-20 NOTE — Pre-Procedure Instructions (Signed)
20 Joanna [name]  01/20/2011   Your procedure is scheduled on:  01/25/11  Report to [facility] at 9:00 AM.  Call this number if you have problems the morning of surgery: [phone]   Remember:   Do not eat food:After Midnight.  May have clear liquids: up to 4 Hours before arrival.  Clear liquids include soda, tea, black coffee, apple or grape juice, broth.  Take these medicines the morning of surgery with A SIP OF WATER: Evista   Do not wear jewelry, make-up or nail polish.  Do not wear lotions, powders, or perfumes. You may wear deodorant.  Do not shave 48 hours prior to surgery.  Do not bring valuables to the hospital.  Contacts, dentures or bridgework may not be worn into surgery.  Leave suitcase in the car. After surgery it may be brought to your room.  For patients admitted to the hospital, checkout time is 11:00 AM the day of discharge.   Patients discharged the day of surgery will not be allowed to drive home.  Name and phone number of your driver: Bill spouse cell [phone]  Special Instructions: CHG Shower Use Special Wash: 1/2 bottle night before surgery and 1/2 bottle morning of surgery.   Please read over the following fact sheets that you were given: Pain Booklet, Coughing and Deep Breathing, MRSA Information and Surgical Site Infection Prevention

## 2011-01-24 MED ORDER — CEFAZOLIN SODIUM-DEXTROSE 2-3 GM-% IV SOLR
2.0000 g | INTRAVENOUS | Status: AC
  Administered 2011-01-25: 2 g via INTRAVENOUS
  Filled 2011-01-24: qty 50

## 2011-01-25 ENCOUNTER — Encounter (HOSPITAL_COMMUNITY)
Admission: RE | Disposition: A | Discharge status: Home / Self Care | Payer: Self-pay | Source: Ambulatory Visit | Attending: Plastic Surgery

## 2011-01-25 ENCOUNTER — Encounter (HOSPITAL_COMMUNITY): Payer: Self-pay | Admitting: Anesthesiology

## 2011-01-25 ENCOUNTER — Ambulatory Visit (HOSPITAL_COMMUNITY)
Admission: RE | Admit: 2011-01-25 | Discharge: 2011-01-26 | DRG: 268 | Disposition: A | Discharge status: Home / Self Care | Payer: BC Managed Care – PPO | Source: Ambulatory Visit | Attending: Plastic Surgery | Admitting: Plastic Surgery

## 2011-01-25 ENCOUNTER — Other Ambulatory Visit: Payer: Self-pay | Admitting: Plastic Surgery

## 2011-01-25 ENCOUNTER — Ambulatory Visit (HOSPITAL_COMMUNITY): Payer: BC Managed Care – PPO | Admitting: Anesthesiology

## 2011-01-25 DIAGNOSIS — Z853 Personal history of malignant neoplasm of breast: Secondary | ICD-10-CM | POA: Insufficient documentation

## 2011-01-25 DIAGNOSIS — K219 Gastro-esophageal reflux disease without esophagitis: Secondary | ICD-10-CM | POA: Insufficient documentation

## 2011-01-25 DIAGNOSIS — Z01812 Encounter for preprocedural laboratory examination: Secondary | ICD-10-CM | POA: Insufficient documentation

## 2011-01-25 DIAGNOSIS — I10 Essential (primary) hypertension: Secondary | ICD-10-CM | POA: Insufficient documentation

## 2011-01-25 DIAGNOSIS — G4730 Sleep apnea, unspecified: Secondary | ICD-10-CM | POA: Insufficient documentation

## 2011-01-25 DIAGNOSIS — N651 Disproportion of reconstructed breast: Secondary | ICD-10-CM | POA: Insufficient documentation

## 2011-01-25 HISTORY — DX: Personal history of other diseases of the nervous system and sense organs: Z86.69

## 2011-01-25 HISTORY — DX: Pure hypercholesterolemia, unspecified: E78.00

## 2011-01-25 HISTORY — PX: BREAST REDUCTION SURGERY: SHX8

## 2011-01-25 HISTORY — PX: BREAST SURGERY: SHX581

## 2011-01-25 SURGERY — BREAST REDUCTION WITH LIPOSUCTION
Anesthesia: General | Site: Breast | Laterality: Right | Wound class: Clean

## 2011-01-25 MED ORDER — SCOPOLAMINE 1 MG/3DAYS TD PT72
MEDICATED_PATCH | TRANSDERMAL | Status: DC | PRN
  Administered 2011-01-25: 1 via TRANSDERMAL

## 2011-01-25 MED ORDER — SODIUM CHLORIDE 0.9 % IJ SOLN: 9.0000 mL | INTRAMUSCULAR | Status: DC | PRN

## 2011-01-25 MED ORDER — HYDROCHLOROTHIAZIDE 12.5 MG PO CAPS
12.5000 mg | ORAL_CAPSULE | Freq: Every day | ORAL | Status: DC
  Administered 2011-01-25: 12.5 mg via ORAL
  Filled 2011-01-25 (×2): qty 1

## 2011-01-25 MED ORDER — LIDOCAINE HCL (CARDIAC) 20 MG/ML IV SOLN
INTRAVENOUS | Status: DC | PRN
  Administered 2011-01-25: 100 mg via INTRAVENOUS

## 2011-01-25 MED ORDER — DOCUSATE SODIUM 100 MG PO CAPS: 100.0000 mg | ORAL_CAPSULE | Freq: Every day | ORAL | Status: DC

## 2011-01-25 MED ORDER — DEXAMETHASONE SODIUM PHOSPHATE 4 MG/ML IJ SOLN
INTRAMUSCULAR | Status: DC | PRN
  Administered 2011-01-25: 4 mg via INTRAVENOUS

## 2011-01-25 MED ORDER — FENTANYL CITRATE 0.05 MG/ML IJ SOLN
INTRAMUSCULAR | Status: DC | PRN
  Administered 2011-01-25 (×7): 50 ug via INTRAVENOUS

## 2011-01-25 MED ORDER — HYDROMORPHONE HCL PF 1 MG/ML IJ SOLN
0.2500 mg | INTRAMUSCULAR | Status: DC | PRN
  Administered 2011-01-25 (×2): 0.5 mg via INTRAVENOUS

## 2011-01-25 MED ORDER — ONDANSETRON HCL 4 MG/2ML IJ SOLN
INTRAMUSCULAR | Status: DC | PRN
  Administered 2011-01-25: 4 mg via INTRAVENOUS

## 2011-01-25 MED ORDER — PNEUMOCOCCAL VAC POLYVALENT 25 MCG/0.5ML IJ INJ
0.5000 mL | INJECTION | INTRAMUSCULAR | Status: DC
  Filled 2011-01-25: qty 0.5

## 2011-01-25 MED ORDER — METHOCARBAMOL 500 MG PO TABS
500.0000 mg | ORAL_TABLET | Freq: Four times a day (QID) | ORAL | Status: DC | PRN
  Filled 2011-01-25: qty 1

## 2011-01-25 MED ORDER — DIPHENHYDRAMINE HCL 12.5 MG/5ML PO ELIX
12.5000 mg | ORAL_SOLUTION | Freq: Four times a day (QID) | ORAL | Status: DC | PRN
  Filled 2011-01-25: qty 5

## 2011-01-25 MED ORDER — RALOXIFENE HCL 60 MG PO TABS
60.0000 mg | ORAL_TABLET | Freq: Every day | ORAL | Status: DC
  Administered 2011-01-26: 60 mg via ORAL
  Filled 2011-01-25 (×2): qty 1

## 2011-01-25 MED ORDER — TRAZODONE HCL 100 MG PO TABS
100.0000 mg | ORAL_TABLET | Freq: Every day | ORAL | Status: DC
  Administered 2011-01-25: 100 mg via ORAL
  Filled 2011-01-25 (×2): qty 1

## 2011-01-25 MED ORDER — PROMETHAZINE HCL 25 MG/ML IJ SOLN
6.2500 mg | INTRAMUSCULAR | Status: DC | PRN
  Administered 2011-01-25: 6.25 mg via INTRAVENOUS

## 2011-01-25 MED ORDER — LACTATED RINGERS IV SOLN
INTRAVENOUS | Status: DC
  Administered 2011-01-25 (×2): via INTRAVENOUS

## 2011-01-25 MED ORDER — PROMETHAZINE HCL 25 MG/ML IJ SOLN
INTRAMUSCULAR | Status: AC
  Filled 2011-01-25: qty 1

## 2011-01-25 MED ORDER — LACTATED RINGERS IV SOLN: INTRAVENOUS | Status: DC

## 2011-01-25 MED ORDER — PROMETHAZINE HCL 25 MG/ML IJ SOLN: 6.2500 mg | INTRAMUSCULAR | Status: DC | PRN

## 2011-01-25 MED ORDER — MIDAZOLAM HCL 5 MG/5ML IJ SOLN
INTRAMUSCULAR | Status: DC | PRN
  Administered 2011-01-25: 2 mg via INTRAVENOUS

## 2011-01-25 MED ORDER — 0.9 % SODIUM CHLORIDE (POUR BTL) OPTIME
TOPICAL | Status: DC | PRN
  Administered 2011-01-25: 1000 mL

## 2011-01-25 MED ORDER — ROCURONIUM BROMIDE 100 MG/10ML IV SOLN
INTRAVENOUS | Status: DC | PRN
  Administered 2011-01-25: 50 mg via INTRAVENOUS

## 2011-01-25 MED ORDER — LISINOPRIL 20 MG PO TABS
20.0000 mg | ORAL_TABLET | Freq: Every day | ORAL | Status: DC
  Administered 2011-01-25: 20 mg via ORAL
  Filled 2011-01-25 (×2): qty 1

## 2011-01-25 MED ORDER — NEOSTIGMINE METHYLSULFATE 1 MG/ML IJ SOLN
INTRAMUSCULAR | Status: DC | PRN
  Administered 2011-01-25: 5 mg via INTRAVENOUS

## 2011-01-25 MED ORDER — GLYCOPYRROLATE 0.2 MG/ML IJ SOLN
INTRAMUSCULAR | Status: DC | PRN
  Administered 2011-01-25: 0.2 mg via INTRAVENOUS
  Administered 2011-01-25: .8 mg via INTRAVENOUS

## 2011-01-25 MED ORDER — LACTATED RINGERS IV SOLN
INTRAVENOUS | Status: DC | PRN
  Administered 2011-01-25 (×2): via INTRAVENOUS

## 2011-01-25 MED ORDER — CEFAZOLIN SODIUM 1-5 GM-% IV SOLN
1.0000 g | Freq: Three times a day (TID) | INTRAVENOUS | Status: DC
  Administered 2011-01-25 – 2011-01-26 (×3): 1 g via INTRAVENOUS
  Filled 2011-01-25 (×5): qty 50

## 2011-01-25 MED ORDER — METHOCARBAMOL 500 MG PO TABS
500.0000 mg | ORAL_TABLET | Freq: Four times a day (QID) | ORAL | Status: DC
  Administered 2011-01-25: 500 mg via ORAL
  Filled 2011-01-25 (×6): qty 1

## 2011-01-25 MED ORDER — PROPOFOL 10 MG/ML IV EMUL
INTRAVENOUS | Status: DC | PRN
  Administered 2011-01-25: 200 mg via INTRAVENOUS

## 2011-01-25 MED ORDER — ONDANSETRON HCL 4 MG/2ML IJ SOLN
4.0000 mg | Freq: Four times a day (QID) | INTRAMUSCULAR | Status: DC | PRN
  Filled 2011-01-25: qty 2

## 2011-01-25 MED ORDER — DIPHENHYDRAMINE HCL 50 MG/ML IJ SOLN
12.5000 mg | Freq: Four times a day (QID) | INTRAMUSCULAR | Status: DC | PRN
  Filled 2011-01-25: qty 0.25

## 2011-01-25 MED ORDER — VECURONIUM BROMIDE 10 MG IV SOLR
INTRAVENOUS | Status: DC | PRN
  Administered 2011-01-25: 1 mg via INTRAVENOUS
  Administered 2011-01-25: 2 mg via INTRAVENOUS

## 2011-01-25 MED ORDER — NALOXONE HCL 0.4 MG/ML IJ SOLN
0.4000 mg | INTRAMUSCULAR | Status: DC | PRN
  Filled 2011-01-25: qty 1

## 2011-01-25 MED ORDER — HYDROMORPHONE 0.3 MG/ML IV SOLN
INTRAVENOUS | Status: DC
  Administered 2011-01-25: 0.399 mg via INTRAVENOUS
  Administered 2011-01-25: 18:00:00 via INTRAVENOUS
  Administered 2011-01-25: 0.4 mg via INTRAVENOUS
  Administered 2011-01-26: 0.2 mg via INTRAVENOUS
  Filled 2011-01-25 (×2): qty 25

## 2011-01-25 MED ORDER — LISINOPRIL-HYDROCHLOROTHIAZIDE 20-12.5 MG PO TABS: 1.0000 | ORAL_TABLET | Freq: Every day | ORAL | Status: DC

## 2011-01-25 SURGICAL SUPPLY — 50 items
ATCH SMKEVC FLXB CAUT HNDSWH (FILTER) ×1 IMPLANT
BANDAGE SF3 ACE 3 X5YD (GAUZE/BANDAGES/DRESSINGS) ×2 IMPLANT
BINDER BREAST LRG (GAUZE/BANDAGES/DRESSINGS) IMPLANT
BINDER BREAST MEDIUM (GAUZE/BANDAGES/DRESSINGS) IMPLANT
BINDER BREAST XLRG (GAUZE/BANDAGES/DRESSINGS) IMPLANT
BINDER BREAST XXLRG (GAUZE/BANDAGES/DRESSINGS) IMPLANT
BIOPATCH RED 1 DISK 7.0 (GAUZE/BANDAGES/DRESSINGS) IMPLANT
CHLORAPREP W/TINT 26ML (MISCELLANEOUS) ×2 IMPLANT
CLOSURE STERI STRIP 1/2 X4 (GAUZE/BANDAGES/DRESSINGS) ×2 IMPLANT
CLOTH BEACON ORANGE TIMEOUT ST (SAFETY) ×2 IMPLANT
COVER SURGICAL LIGHT HANDLE (MISCELLANEOUS) ×2 IMPLANT
DRAIN CHANNEL 19F RND (DRAIN) IMPLANT
DRAPE LAPAROSCOPIC ABDOMINAL (DRAPES) IMPLANT
DRAPE WARM FLUID 44X44 (DRAPE) ×2 IMPLANT
DRSG PAD ABDOMINAL 8X10 ST (GAUZE/BANDAGES/DRESSINGS) ×2 IMPLANT
ELECT CAUTERY BLADE 6.4 (BLADE) ×2 IMPLANT
ELECT REM PT RETURN 9FT ADLT (ELECTROSURGICAL) ×2
ELECTRODE REM PT RTRN 9FT ADLT (ELECTROSURGICAL) ×1 IMPLANT
EVACUATOR SILICONE 100CC (DRAIN) IMPLANT
EVACUATOR SMOKE ACCUVAC VALLEY (FILTER) ×1
GAUZE XEROFORM 5X9 LF (GAUZE/BANDAGES/DRESSINGS) ×2 IMPLANT
GLOVE BIO SURGEON STRL SZ7.5 (GLOVE) ×2 IMPLANT
GLOVE BIOGEL PI IND STRL 8 (GLOVE) ×1 IMPLANT
GLOVE BIOGEL PI INDICATOR 8 (GLOVE) ×1
GOWN PREVENTION PLUS XLARGE (GOWN DISPOSABLE) ×2 IMPLANT
GOWN STRL NON-REIN LRG LVL3 (GOWN DISPOSABLE) ×2 IMPLANT
KIT BASIN OR (CUSTOM PROCEDURE TRAY) ×2 IMPLANT
KIT ROOM TURNOVER OR (KITS) ×2 IMPLANT
NS IRRIG 1000ML POUR BTL (IV SOLUTION) ×2 IMPLANT
PACK GENERAL/GYN (CUSTOM PROCEDURE TRAY) ×2 IMPLANT
PAD ARMBOARD 7.5X6 YLW CONV (MISCELLANEOUS) ×4 IMPLANT
PEN SKIN MARKING BROAD (MISCELLANEOUS) ×2 IMPLANT
PREFILTER EVAC NS 1 1/3-3/8IN (MISCELLANEOUS) ×2 IMPLANT
SPONGE GAUZE 4X4 12PLY (GAUZE/BANDAGES/DRESSINGS) ×4 IMPLANT
SPONGE GAUZE 4X4 STERILE 39 (GAUZE/BANDAGES/DRESSINGS) ×2 IMPLANT
SPONGE LAP 18X18 X RAY DECT (DISPOSABLE) IMPLANT
STAPLER VISISTAT 35W (STAPLE) IMPLANT
STRIP CLOSURE SKIN 1/2X4 (GAUZE/BANDAGES/DRESSINGS) ×4 IMPLANT
SUT MNCRL AB 3-0 PS2 18 (SUTURE) ×16 IMPLANT
SUT MON AB 2-0 CT1 36 (SUTURE) IMPLANT
SUT PROLENE 5 0 PS 2 (SUTURE) ×4 IMPLANT
SUT SILK 4 0 PS 2 (SUTURE) ×8 IMPLANT
SUT VIC AB 2-0 FS1 27 (SUTURE) ×10 IMPLANT
SYR BULB IRRIGATION 50ML (SYRINGE) ×2 IMPLANT
TAPE CLOTH SURG 4X10 WHT LF (GAUZE/BANDAGES/DRESSINGS) ×2 IMPLANT
TOWEL OR 17X24 6PK STRL BLUE (TOWEL DISPOSABLE) ×2 IMPLANT
TOWEL OR 17X26 10 PK STRL BLUE (TOWEL DISPOSABLE) ×4 IMPLANT
TRAY FOLEY CATH 14FRSI W/METER (CATHETERS) IMPLANT
TUBE CONNECTING 12X1/4 (SUCTIONS) ×2 IMPLANT
WATER STERILE IRR 1000ML POUR (IV SOLUTION) IMPLANT

## 2011-01-25 NOTE — Anesthesia Preprocedure Evaluation (Signed)
Anesthesia Evaluation  Patient identified by MRN, date of birth, ID band Patient awake    Reviewed: Allergy & Precautions, H&P , NPO status   History of Anesthesia Complications (+) PONV  Airway Mallampati: II  Neck ROM: Full    Dental  (+) Poor Dentition, Missing, Caps and Dental Advisory Given   Pulmonary sleep apnea , Recent URI , Resolved,  clear to auscultation        Cardiovascular hypertension, - anginaRegular Normal    Neuro/Psych    GI/Hepatic Neg liver ROS, hiatal hernia, GERD-  ,  Endo/Other    Renal/GU negative Renal ROS     Musculoskeletal   Abdominal (+) obese,   Peds  Hematology   Anesthesia Other Findings   Reproductive/Obstetrics                           Anesthesia Physical Anesthesia Plan  ASA: III  Anesthesia Plan: General   Post-op Pain Management:    Induction: Intravenous  Airway Management Planned: Oral ETT  Additional Equipment:   Intra-op Plan:   Post-operative Plan: Extubation in OR  Informed Consent: I have reviewed the patients History and Physical, chart, labs and discussed the procedure including the risks, benefits and alternatives for the proposed anesthesia with the patient or authorized representative who has indicated his/her understanding and acceptance.   Dental advisory given  Plan Discussed with: CRNA and Surgeon  Anesthesia Plan Comments:         Anesthesia Quick Evaluation

## 2011-01-25 NOTE — H&P (Signed)
I have reexamined and reevaluated the patient and there are no changes. See office note dated 18 January 2011.

## 2011-01-25 NOTE — Anesthesia Procedure Notes (Addendum)
Procedure Name: Intubation Date/Time: 01/25/2011 12:15 PM Performed by: Jefm Miles E Pre-anesthesia Checklist: Patient identified, Emergency Drugs available, Suction available, Patient being monitored and Timeout performed Patient Re-evaluated:Patient Re-evaluated prior to inductionOxygen Delivery Method: Circle System Utilized Preoxygenation: Pre-oxygenation with 100% oxygen Intubation Type: IV induction Ventilation: Mask ventilation without difficulty Laryngoscope Size: Mac and 3 Grade View: Grade I Tube type: Oral Tube size: 7.0 mm Number of attempts: 1 Airway Equipment and Method: video-laryngoscopy Placement Confirmation: ETT inserted through vocal cords under direct vision,  positive ETCO2 and breath sounds checked- equal and bilateral Secured at: 21 cm Tube secured with: Tape Dental Injury: Teeth and Oropharynx as per pre-operative assessment  Difficulty Due To: Difficulty was anticipated, Difficult Airway- due to limited oral opening and Difficult Airway- due to dentition Comments: Patient has short neck, small oral opening and poor dentition. Successful intubation with Glidescope, recommend Glidescope for all future intubations.

## 2011-01-25 NOTE — Anesthesia Postprocedure Evaluation (Signed)
  Anesthesia Post-op Note  Patient: Joanna [name]  Procedure(s) Performed:  BREAST REDUCTION WITH LIPOSUCTION - Right Breast Reduction   Patient Location: PACU  Anesthesia Type: General  Level of Consciousness: sedated  Airway and Oxygen Therapy: Patient Spontanous Breathing and Patient connected to nasal cannula oxygen  Post-op Pain: mild  Post-op Assessment: Post-op Vital signs reviewed, Patient's Cardiovascular Status Stable, Respiratory Function Stable, Patent Airway, No signs of Nausea or vomiting and Pain level controlled  Post-op Vital Signs: Reviewed and stable  Complications: No apparent anesthesia complications

## 2011-01-25 NOTE — Transfer of Care (Signed)
Immediate Anesthesia Transfer of Care Note  Patient: Joanna [name]  Procedure(s) Performed:  BREAST REDUCTION WITH LIPOSUCTION - Right Breast Reduction   Patient Location: PACU  Anesthesia Type: General  Level of Consciousness: awake, alert  and oriented  Airway & Oxygen Therapy: Patient Spontanous Breathing and Patient connected to nasal cannula oxygen  Post-op Assessment: Report given to PACU RN  Post vital signs: Reviewed and stable  Complications: No apparent anesthesia complications

## 2011-01-25 NOTE — Brief Op Note (Signed)
01/25/2011  5:30 PM  PATIENT:  Joanna [name]  55 y.o. female  PRE-OPERATIVE DIAGNOSIS:  Left breast cancer and right macromastia  POST-OPERATIVE DIAGNOSIS:  Same  PROCEDURE:  Procedure(s): RIGHT BREAST REDUCTION  SURGEON:  Surgeon(s): Rossie Muskrat  ASSISTANTS: Tanda Rockers, RNFA   ANESTHESIA:   general  EBL:  Total I/O In: 1100 [I.V.:1100] Out: 100 [Blood:100]  BLOOD ADMINISTERED:none  DRAINS: none   LOCAL MEDICATIONS USED:  NONE  SPECIMEN:  Source of Specimen:  right breast  DISPOSITION OF SPECIMEN:  PATHOLOGY  COUNTS:  YES  TOURNIQUET:  * No tourniquets in log *  DICTATION: .Other Dictation: Dictation Number [id number]  PLAN OF CARE: Admit for overnight observation  PATIENT DISPOSITION:  PACU - hemodynamically stable.   Delay start of Pharmacological VTE agent (>24hrs) due to surgical blood loss or risk of bleeding:  {YES/NO/NOT APPLICABLE:20182

## 2011-01-25 NOTE — Progress Notes (Signed)
Chest soft. No evidence of bleeding or hematoma. Nipple complex pink with good capillary refill. Viable.  Doing well.

## 2011-01-25 NOTE — Preoperative (Signed)
Beta Blockers   Reason not to administer Beta Blockers:Not Applicable 

## 2011-01-26 ENCOUNTER — Encounter (HOSPITAL_COMMUNITY): Payer: Self-pay | Admitting: General Practice

## 2011-01-26 DIAGNOSIS — Z8669 Personal history of other diseases of the nervous system and sense organs: Secondary | ICD-10-CM

## 2011-01-26 HISTORY — DX: Personal history of other diseases of the nervous system and sense organs: Z86.69

## 2011-01-26 MED ORDER — CHLORHEXIDINE GLUCONATE 0.12 % MT SOLN: 15.0000 mL | Freq: Two times a day (BID) | OROMUCOSAL | Status: DC

## 2011-01-26 MED ORDER — BIOTENE DRY MOUTH MT LIQD: 15.0000 mL | Freq: Two times a day (BID) | OROMUCOSAL | Status: DC

## 2011-01-26 NOTE — Progress Notes (Signed)
UR of chart completed retroactively.   

## 2011-01-26 NOTE — Op Note (Signed)
[name], [name] NO.:  [id number]  MEDICAL RECORD NO.:  [id number]  LOCATION:  5153                         FACILITY:  [facility]  PHYSICIAN:  Etter Sjogren, M.D.     DATE OF BIRTH:  April 20, 1955  DATE OF PROCEDURE:  01/25/2011 DATE OF DISCHARGE:                              OPERATIVE REPORT   PREOPERATIVE DIAGNOSIS:  Breast cancer, left, with macromastia right.  POSTOPERATIVE DIAGNOSIS:  Breast cancer, left, with macromastia right.  PROCEDURE PERFORMED:  A right breast reduction as a planned staged procedure.  SURGEON:  Etter Sjogren, M.D.  ANESTHESIA:  General.  ESTIMATED BLOOD LOSS:  70 mL.  CLINICAL NOTE:  This 55 year old woman has had breast cancer on the left side.  She has a large breast on the right and presents for a breast reduction on the right for symmetry.  This is a planned stage procedure in her treatment process.  She desired a breast reduction for symmetry. The nature of the procedure and risks plus complications were discussed with her including not limited to bleeding, infection, anesthesia complications, healing problems, scarring, loss of sensation, fluid accumulations, asymmetry disappointment, loss of tissue, loss of nipple, loss of skin, loss of fatty tissue, and pulmonary embolism, and she understood all of this and wished to proceed.  DESCRIPTION OF PROCEDURE:  The patient was placed in a full standing position in the holding area, marked for the right breast reduction. She was then taken to the operating room, placed supine.  After successful general anesthesia, she was prepped with ChloraPrep, and after waiting full 3 minutes for drying, she was draped with sterile drapes.  The 42 mm marker marked the nipple-areolar complex, 8 cm wide inferior nipple-areolar pedicle designed, incisions made, inferior nipple areolar pedicle de-epithelialized and then isolated from the surrounding tissues beveling in an outward direction medial  and lateral in order to ensure a broader attachment at the level of the chest wall that was present at the level of the skin.  At the conclusion of this dissection, nipple complex was inspected and was found to have pink color with bright red bleeding around the periphery of it consistent with viability.  Resections were performed medial, central, lateral, total of 520 g resected.  Thorough irrigation with saline and meticulous hemostasis with electrocautery.  Having achieved excellent hemostasis, the closure with a 2-0 Vicryl interrupted inverted suture at the inverted T position followed by 3-0 Monocryl interrupted inverted deep dermal sutures and running 3-0 Monocryl subcuticular suture.  A measurement was then taken 5 cm up from the inframammary crease and the 42 mm marker marked the site for the nipple-areolar complex.  Tissue resected, irrigated with saline, hemostasis with electrocautery, nipple- areolar complex brought through this site and again inspected and found to have excellent color again pink with bright red bleeding around the periphery of it consistent with viability.  The nipple-areolar insetting with 3-0 Monocryl interrupted inverted deep dermal sutures and a running 3-0 Monocryl subcuticular suture.  Steri-Strips, Xeroform gauze, dry sterile dressing and a circumferential Ace wrap applied.  She was transported to the recovery room in stable having tolerated the procedure well.     Onalee Hua  Odis Luster, M.D.     DB/MEDQ  D:  01/25/2011  T:  01/26/2011  Job:  [id number]

## 2011-01-26 NOTE — Discharge Summary (Signed)
Physician Discharge Summary  Patient ID: Joanna [name] MRN: [id number] DOB/AGE: Aug 09, 1955 55 y.o.  Admit date: 01/25/2011 Discharge date: 01/26/2011  Admission Diagnoses: Left breast cancer and right macromastia  Discharge Diagnoses: Same Active Problems:  * No active hospital problems. *    Discharged Condition: good  Hospital Course: On the day of admission the patient was taken to surgery and had right breast reduction. The patient tolerated the procedures well. Postoperatively, the nipplr complex maintained excellent color and capillary refill. The patient was ambulatory and tolerating diet on the first postoperative day. Her BP was slightly lower than admission but no evidence of bleeding. Very little blood loss in surgery.She is not tachycardic.  Treatments: antibiotics: Ancef, anticoagulation: none and surgery: Right breast reduction  Discharge Exam: Blood pressure 101/49, pulse 63, temperature 98.2 F (36.8 C), temperature source Oral, resp. rate 16, height 5\' 1"  (1.549 m), weight 266 lb 12.1 oz (121 kg), SpO2 95.00%.  Operative sites: Nipple complex has capillary refill 1-2 sec. Pink color. Viable. Chest soft. No evidence of bleeding or hematoma.  Disposition: Home or Self Care   Current Discharge Medication List    CONTINUE these medications which have NOT CHANGED   Details  amoxicillin-clavulanate (AUGMENTIN) 875-125 MG per tablet Take 1 tablet by mouth 2 (two) times daily.      Calcium Carbonate-Vit D-Min (CALCIUM 1200 PO) Take 1 tablet by mouth daily.      cholecalciferol (VITAMIN D) 1000 UNITS tablet Take 1,000 Units by mouth daily.      HYDROmorphone (DILAUDID) 2 MG tablet 1 to 2 tablets prn pain every 4 hours if needed for pain Qty: 70 tablet, Refills: 0    lisinopril-hydrochlorothiazide (PRINZIDE,ZESTORETIC) 20-12.5 MG per tablet Take 1 tablet by mouth daily.      methocarbamol (ROBAXIN) 500 MG tablet Take 500 mg by mouth daily as needed. For muscle  spasms     Probiotic Product (PROBIOTIC PO) Take 1 capsule by mouth daily.      raloxifene (EVISTA) 60 MG tablet Take 60 mg by mouth daily.      traZODone (DESYREL) 100 MG tablet Take 100 mg by mouth at bedtime.           SignedRossie Muskrat 01/26/2011, 7:40 AM

## 2011-01-26 NOTE — Progress Notes (Signed)
Pt given dc instructions. Rn went over medications and incision/wound care. Pt verbalized understanding and had no questions regarding care of instructions.  

## 2011-10-03 LAB — HM COLONOSCOPY

## 2012-07-06 DIAGNOSIS — G4700 Insomnia, unspecified: Secondary | ICD-10-CM | POA: Insufficient documentation

## 2013-01-29 IMAGING — CR DG CHEST 2V
2 series · 2 of 2 positions shown · non-contrast
Comparison: None.

CLINICAL DATA: Preop for surgery.  Hypertension meds.  Sinus
drainage.

CHEST - 2 VIEW

[view not recorded (1 of 2)]
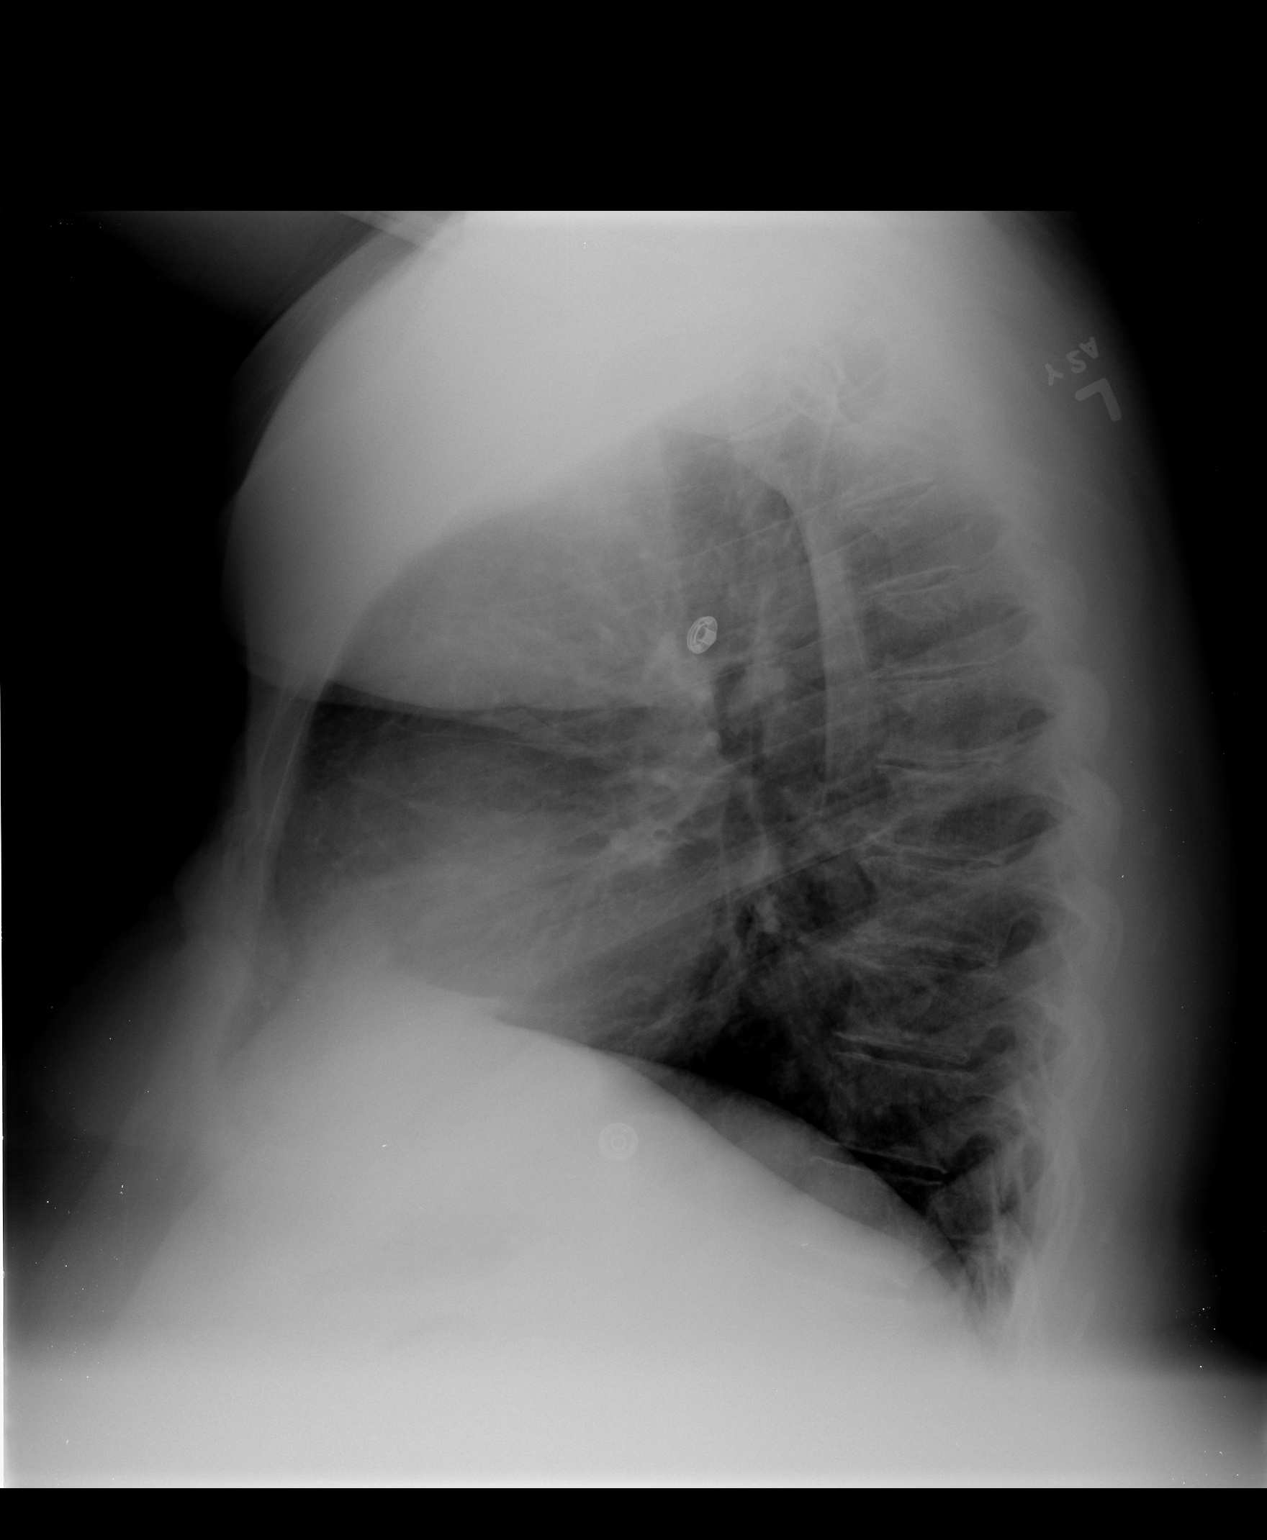

[view not recorded (2 of 2)]
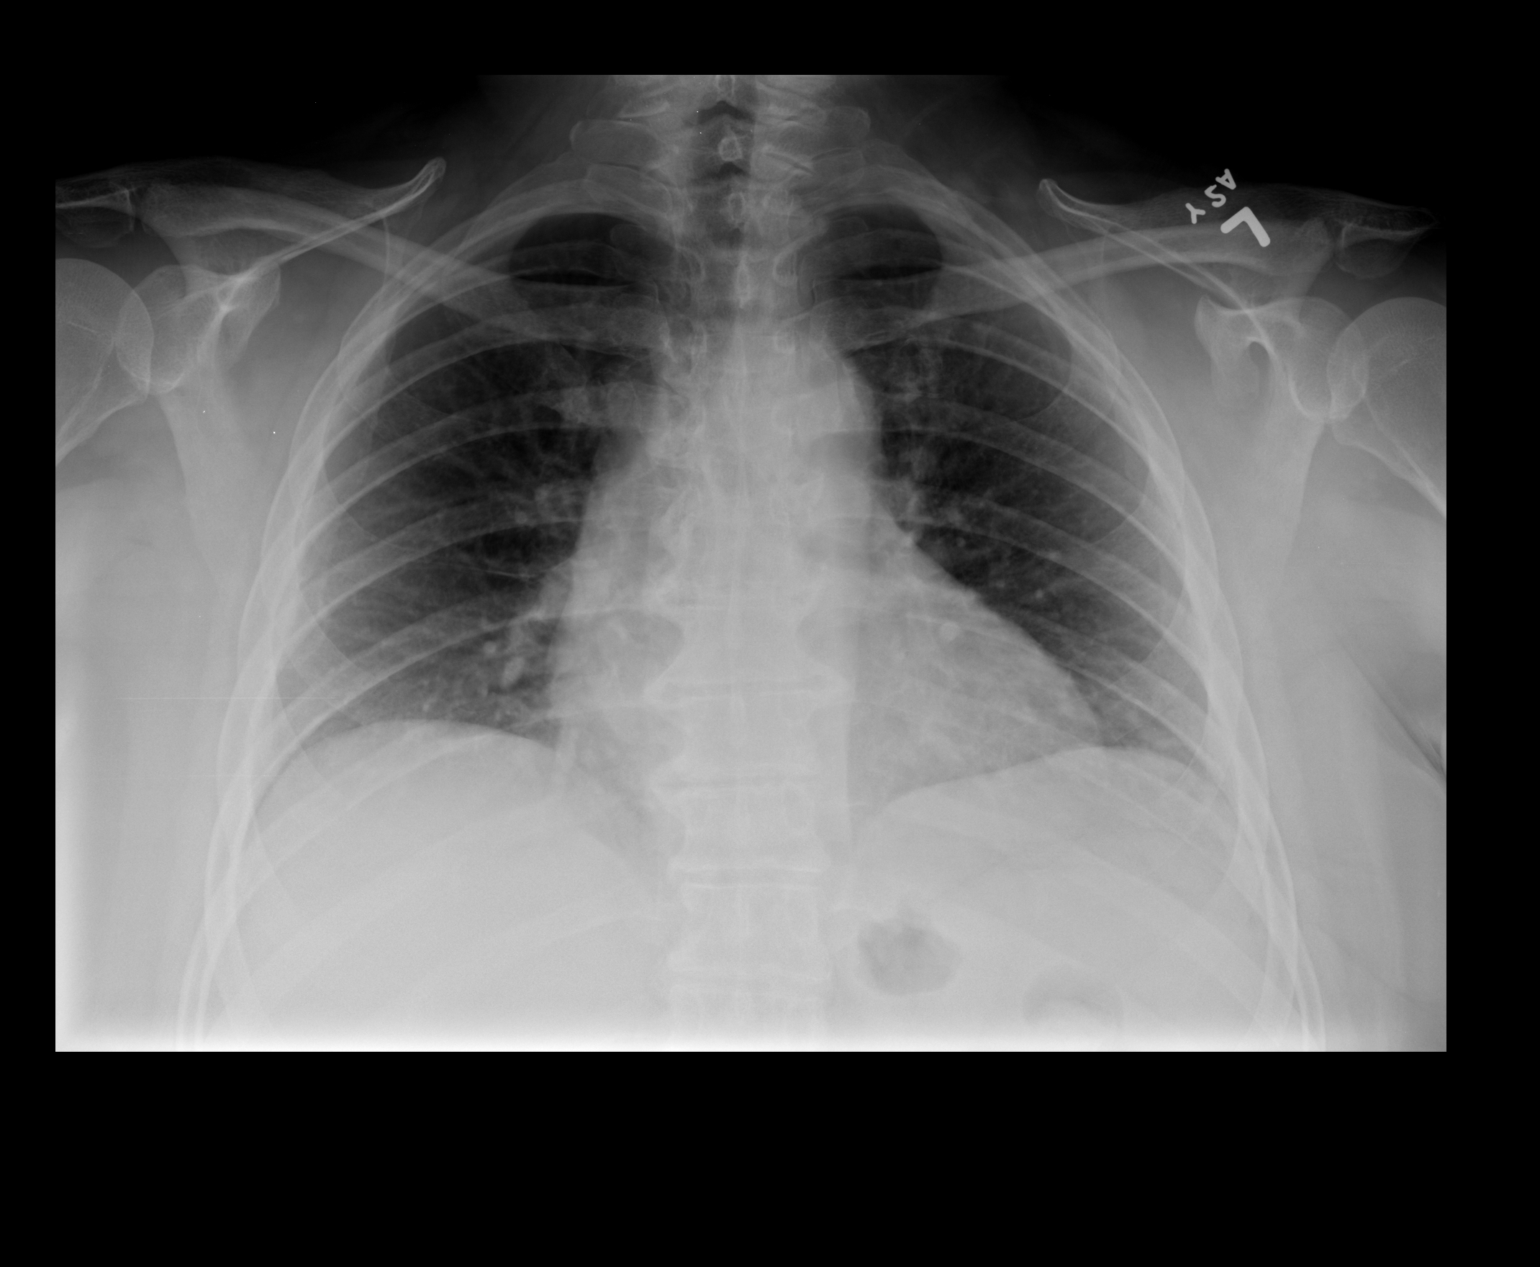

[2 of 2 positions shown; findings below may reference images not displayed]

FINDINGS: Heart size is upper limits normal.  No evidence for
pulmonary edema.  There are no focal consolidations or pleural
effusions.  Degenerative changes are seen in the spine.  No lytic
or blastic lesions identified.
IMPRESSION: No evidence for acute cardiopulmonary abnormality.

## 2016-10-12 DIAGNOSIS — Z86000 Personal history of in-situ neoplasm of breast: Secondary | ICD-10-CM

## 2017-07-23 DIAGNOSIS — Z86000 Personal history of in-situ neoplasm of breast: Secondary | ICD-10-CM

## 2017-07-23 DIAGNOSIS — N6459 Other signs and symptoms in breast: Secondary | ICD-10-CM

## 2017-07-23 DIAGNOSIS — Z9012 Acquired absence of left breast and nipple: Secondary | ICD-10-CM

## 2018-10-17 DIAGNOSIS — C50419 Malignant neoplasm of upper-outer quadrant of unspecified female breast: Secondary | ICD-10-CM

## 2018-10-17 DIAGNOSIS — D0512 Intraductal carcinoma in situ of left breast: Secondary | ICD-10-CM

## 2019-03-12 DIAGNOSIS — R079 Chest pain, unspecified: Secondary | ICD-10-CM | POA: Insufficient documentation

## 2019-03-14 ENCOUNTER — Other Ambulatory Visit: Payer: Self-pay | Admitting: Physician Assistant

## 2019-03-14 MED ORDER — ALPRAZOLAM 0.5 MG PO TBDP: 0.5000 mg | ORAL_TABLET | Freq: Two times a day (BID) | ORAL | 0 refills | Status: DC | PRN

## 2019-03-17 ENCOUNTER — Encounter: Payer: Self-pay | Admitting: Physician Assistant

## 2019-03-17 ENCOUNTER — Ambulatory Visit: Payer: Managed Care, Other (non HMO) | Admitting: Physician Assistant

## 2019-03-17 ENCOUNTER — Other Ambulatory Visit: Payer: Self-pay

## 2019-03-17 VITALS — BP 138/84 | HR 48 | Temp 98.0°F | Resp 16 | Ht 62.0 in | Wt 292.0 lb

## 2019-03-17 DIAGNOSIS — J0100 Acute maxillary sinusitis, unspecified: Secondary | ICD-10-CM | POA: Diagnosis not present

## 2019-03-17 DIAGNOSIS — F419 Anxiety disorder, unspecified: Secondary | ICD-10-CM

## 2019-03-17 DIAGNOSIS — E782 Mixed hyperlipidemia: Secondary | ICD-10-CM | POA: Diagnosis not present

## 2019-03-17 DIAGNOSIS — I10 Essential (primary) hypertension: Secondary | ICD-10-CM | POA: Diagnosis not present

## 2019-03-17 MED ORDER — ESCITALOPRAM OXALATE 20 MG PO TABS: 20.0000 mg | ORAL_TABLET | Freq: Every day | ORAL | 1 refills | Status: DC

## 2019-03-17 MED ORDER — CEFDINIR 300 MG PO CAPS: 300.0000 mg | ORAL_CAPSULE | Freq: Two times a day (BID) | ORAL | 0 refills | Status: DC

## 2019-03-17 NOTE — Progress Notes (Signed)
Established Patient Office Visit  Subjective:  Patient ID: Joanna [name], female    DOB: Jun 26, 1955  Age: 64 y.o. MRN: [id number]  CC:  Chief Complaint  Patient presents with  . Follow-up  . Hyperlipidemia    HPI Joanna [name] presents for follow up hypertension and hyperlipidemia  Mixed hyperlipidemia  Pt presents with hyperlipidemia. . Compliance with treatment has beengoodThe patient is compliant with medications, maintains a low cholesterol diet , follows up as directed , and maintains an exercise regimen . The patient denies experiencing any hypercholesterolemia related symptoms.  Evidenced based information based on history , exam, and other sources has been used  for decision making.  She is currently taking crestor 10mg  qd but states she was approved for Repatha but we do not have a copy of that yet - told her to check with pharmacy  Pt presents for follow up of hypertension.  The patient is tolerating the medication well without side effects. Compliance with treatment has been good; including taking medication as directed , maintains a healthy diet and regular exercise regimen , and following up as directed. Patient was evaluated using exam, physical, labs and other information to perform evidence based decision making. Pt currently taking metoprolol 1/2 bid and lisinopril /hctz 20/12.5 qd  Pt with history of anxiety - she is doing well on trazodone and alprazolam - says controls her symptoms well  Pt has had acute symptoms of sinusitis - has had several weeks of thick PND and sinus pressure and drainage - no fever or cough  Past Medical History:  Diagnosis Date  . Angina    "related to stress"  . Arthritis    "fingers"  . Cancer ([place]) 07/2010   left breast  . Difficult intubation    "short neck"  . GERD (gastroesophageal reflux disease)   . H/O hiatal hernia   . Headache(784.0)   . History of migraines 01/26/11   "it's been several years since my last one"  .  Hypercholesteremia   . Hypertension    echo done 2012  [facility] in [place]  . PONV (postoperative nausea and vomiting)   . Recurrent upper respiratory infection (URI)    sinus infection-finishing up antibiotic 01/24/11  . Sleep apnea ~ 2009   sleep study done ; [place]    Past Surgical History:  Procedure Laterality Date  . ABDOMINAL HYSTERECTOMY  1999  . APPENDECTOMY  1999  . BREAST REDUCTION SURGERY  01/25/11   right breast "reduced & lifted"  . BREAST REDUCTION SURGERY  01/25/2011   Procedure: BREAST REDUCTION WITH LIPOSUCTION;  Surgeon: Macon Large;  Location: [place];  Service: Plastics;  Laterality: Right;  Right Breast Reduction   . BREAST SURGERY  08/2010   left mastectomy-patient still has drainage tube in  . BREAST SURGERY  01/25/11   right reduction & lift  . BUNIONECTOMY  ~ 2009   left  . CARPAL TUNNEL RELEASE  1981-1984   bilaterally  . CHOLECYSTECTOMY    . HAMMER TOE SURGERY  ~ 2009   left  . LATISSIMUS FLAP TO BREAST  12/13/2010   Procedure: LATISSIMUS FLAP TO BREAST;  Surgeon: Macon Large;  Location: [place];  Service: Plastics;  Laterality: Left;  LEFT LATISSIMUS FLAP WITH IMPLANT    No family history on file.  Social History   Socioeconomic History  . Marital status: Married    Spouse name: Not on file  . Number of children: Not on  file  . Years of education: Not on file  . Highest education level: Not on file  Occupational History  . Not on file  Tobacco Use  . Smoking status: Never Smoker  . Smokeless tobacco: Never Used  Substance and Sexual Activity  . Alcohol use: Yes    Comment: occ.; haven't drank anything since 01/2010 (01/26/11)  . Drug use: No  . Sexual activity: Yes  Other Topics Concern  . Not on file  Social History Narrative  . Not on file   Social Determinants of Health   Financial Resource Strain:   . Difficulty of Paying Living Expenses: Not on file  Food Insecurity:   . Worried About Charity fundraiser in the  Last Year: Not on file  . Ran Out of Food in the Last Year: Not on file  Transportation Needs:   . Lack of Transportation (Medical): Not on file  . Lack of Transportation (Non-Medical): Not on file  Physical Activity:   . Days of Exercise per Week: Not on file  . Minutes of Exercise per Session: Not on file  Stress:   . Feeling of Stress : Not on file  Social Connections:   . Frequency of Communication with Friends and Family: Not on file  . Frequency of Social Gatherings with Friends and Family: Not on file  . Attends Religious Services: Not on file  . Active Member of Clubs or Organizations: Not on file  . Attends Archivist Meetings: Not on file  . Marital Status: Not on file  Intimate Partner Violence:   . Fear of Current or Ex-Partner: Not on file  . Emotionally Abused: Not on file  . Physically Abused: Not on file  . Sexually Abused: Not on file     Current Outpatient Medications:  .  metoprolol succinate (TOPROL-XL) 25 MG 24 hr tablet, TAKE 1/2 TABLET BY MOUTH TWICE DAILY, Disp: , Rfl:  .  rosuvastatin (CRESTOR) 10 MG tablet, Take by mouth., Disp: , Rfl:  .  traZODone (DESYREL) 150 MG tablet, Take by mouth., Disp: , Rfl:  .  ALPRAZolam (NIRAVAM) 0.5 MG dissolvable tablet, Take 1 tablet (0.5 mg total) by mouth 2 (two) times daily as needed for anxiety., Disp: 60 tablet, Rfl: 0 .  Calcium Carbonate-Vit D-Min (CALCIUM 1200 PO), Take 1 tablet by mouth daily.  , Disp: , Rfl:  .  cefdinir (OMNICEF) 300 MG capsule, Take 1 capsule (300 mg total) by mouth 2 (two) times daily., Disp: 20 capsule, Rfl: 0 .  escitalopram (LEXAPRO) 20 MG tablet, Take 1 tablet (20 mg total) by mouth daily., Disp: 90 tablet, Rfl: 1 .  lisinopril-hydrochlorothiazide (PRINZIDE,ZESTORETIC) 20-12.5 MG per tablet, Take 1 tablet by mouth daily.  , Disp: , Rfl:  .  meloxicam (MOBIC) 15 MG tablet, Take by mouth., Disp: , Rfl:  .  montelukast (SINGULAIR) 10 MG tablet, Take by mouth., Disp: , Rfl:     Allergies  Allergen Reactions  . Penicillins Rash  . Demerol [Meperidine Hcl] Nausea And Vomiting    ROS CONSTITUTIONAL: Negative for chills, fatigue, fever, unintentional weight gain and unintentional weight loss.  E/N/T: see HPI CARDIOVASCULAR: Negative for chest pain, dizziness, palpitations and pedal edema.  RESPIRATORY: Negative for recent cough and dyspnea.  GASTROINTESTINAL: Negative for abdominal pain, acid reflux symptoms, constipation, diarrhea, nausea and vomiting.  MSK: Negative for arthralgias and myalgias.  INTEGUMENTARY: Negative for rash.  NEUROLOGICAL: Negative for dizziness and headaches.  PSYCHIATRIC: Negative for sleep disturbance and  to question depression screen.  Negative for depression, negative for anhedonia.        Objective:    PHYSICAL EXAM:   VS: BP 138/84   Pulse (!) 48   Temp 98 F (36.7 C)   Resp 16   Ht 5\' 2"  (1.575 m)   Wt 292 lb (132.5 kg)   SpO2 97%   BMI 53.41 kg/m   GEN: Well nourished, well developed, in no acute distress  HEENT: normal external ears and nose - normal external auditory canals and TMS - hearing grossly normal - normal nasal mucosa and septum - Lips, Teeth and Gums - erythema/pndCardiac: RRR; no murmurs, rubs, or gallops,no edema - no significant varicosities Respiratory:  normal respiratory rate and pattern with no distress - normal breath sounds with no rales, rhonchi, wheezes or rubs  Skin: warm and dry, no rash  Neuro:  Alert and Oriented x 3, Strength and sensation are intact - CN II-Xii grossly intact Psych: euthymic mood, appropriate affect and demeanor    Health Maintenance Due  Topic Date Due  . Hepatitis C Screening  [birth year]  . HIV Screening  11/10/1971  . TETANUS/TDAP  11/10/1975  . PAP SMEAR-Modifier  11/09/1977  . MAMMOGRAM  11/10/2006  . COLONOSCOPY  11/10/2006  . INFLUENZA VACCINE  08/31/2018    There are no preventive care reminders to display for this patient.  No results found  for: TSH Lab Results  Component Value Date   WBC 7.5 01/20/2011   HGB 12.2 01/20/2011   HCT 36.0 01/20/2011   MCV 86.5 01/20/2011   PLT 244 01/20/2011   Lab Results  Component Value Date   NA 137 01/20/2011   K 4.3 01/20/2011   CO2 27 01/20/2011   GLUCOSE 90 01/20/2011   BUN 9 01/20/2011   CREATININE 0.54 01/20/2011   CALCIUM 9.7 01/20/2011   No results found for: CHOL No results found for: HDL No results found for: LDLCALC No results found for: TRIG No results found for: CHOLHDL No results found for: HGBA1C    Assessment & Plan:   Problem List Items Addressed This Visit    None    Visit Diagnoses    Benign hypertension    -  Primary   Relevant Medications   metoprolol succinate (TOPROL-XL) 25 MG 24 hr tablet   rosuvastatin (CRESTOR) 10 MG tablet   Other Relevant Orders   CBC with Differential/Platelet   Comprehensive metabolic panel   TSH   Mixed hyperlipidemia       Relevant Medications   metoprolol succinate (TOPROL-XL) 25 MG 24 hr tablet   rosuvastatin (CRESTOR) 10 MG tablet   Other Relevant Orders   Lipid panel      Meds ordered this encounter  Medications  . cefdinir (OMNICEF) 300 MG capsule    Sig: Take 1 capsule (300 mg total) by mouth 2 (two) times daily.    Dispense:  20 capsule    Refill:  0    Order Specific Question:   Supervising Provider    AnswerRochel Brome [id number]  . escitalopram (LEXAPRO) 20 MG tablet    Sig: Take 1 tablet (20 mg total) by mouth daily.    Dispense:  90 tablet    Refill:  1    Order Specific Question:   Supervising Provider    AnswerShelton Silvas    Follow-up: Return in about 6 months (around 09/14/2019).    SARA R Ryeleigh Santore, PA-C

## 2019-03-17 NOTE — Assessment & Plan Note (Signed)
Low fat/low chol diet labwork pending

## 2019-03-17 NOTE — Assessment & Plan Note (Signed)
Continue current meds as directed 

## 2019-03-17 NOTE — Assessment & Plan Note (Signed)
Recommend mucinex rx for [employer]

## 2019-03-17 NOTE — Assessment & Plan Note (Signed)
Continue current meds and follow up with cardiology as directed (was just seen last week)

## 2019-03-18 ENCOUNTER — Other Ambulatory Visit: Payer: Self-pay | Admitting: Physician Assistant

## 2019-03-18 LAB — COMPREHENSIVE METABOLIC PANEL
ALT: 15 IU/L (ref 0–32)
AST: 17 IU/L (ref 0–40)
Albumin/Globulin Ratio: 2 (ref 1.2–2.2)
Albumin: 4.5 g/dL (ref 3.8–4.8)
Alkaline Phosphatase: 95 IU/L (ref 39–117)
BUN/Creatinine Ratio: 27 (ref 12–28)
BUN: 15 mg/dL (ref 8–27)
Bilirubin Total: 0.4 mg/dL (ref 0.0–1.2)
CO2: 25 mmol/L (ref 20–29)
Calcium: 9.4 mg/dL (ref 8.7–10.3)
Chloride: 103 mmol/L (ref 96–106)
Creatinine, Ser: 0.56 mg/dL — ABNORMAL LOW (ref 0.57–1.00)
GFR calc Af Amer: 116 mL/min/{1.73_m2} (ref >59)
GFR calc non Af Amer: 100 mL/min/{1.73_m2} (ref >59)
Globulin, Total: 2.3 g/dL (ref 1.5–4.5)
Glucose: 79 mg/dL (ref 65–99)
Potassium: 4.1 mmol/L (ref 3.5–5.2)
Sodium: 143 mmol/L (ref 134–144)
Total Protein: 6.8 g/dL (ref 6.0–8.5)

## 2019-03-18 LAB — CBC WITH DIFFERENTIAL/PLATELET
Basophils Absolute: 0.1 10*3/uL (ref 0.0–0.2)
Basos: 1 %
EOS (ABSOLUTE): 0.5 10*3/uL — ABNORMAL HIGH (ref 0.0–0.4)
Eos: 7 %
Hematocrit: 40.6 % (ref 34.0–46.6)
Hemoglobin: 13.4 g/dL (ref 11.1–15.9)
Immature Grans (Abs): 0 10*3/uL (ref 0.0–0.1)
Immature Granulocytes: 0 %
Lymphocytes Absolute: 2.2 10*3/uL (ref 0.7–3.1)
Lymphs: 31 %
MCH: 29.3 pg (ref 26.6–33.0)
MCHC: 33 g/dL (ref 31.5–35.7)
MCV: 89 fL (ref 79–97)
Monocytes Absolute: 0.5 10*3/uL (ref 0.1–0.9)
Monocytes: 7 %
Neutrophils Absolute: 3.8 10*3/uL (ref 1.4–7.0)
Neutrophils: 54 %
Platelets: 244 10*3/uL (ref 150–450)
RBC: 4.58 x10E6/uL (ref 3.77–5.28)
RDW: 12.8 % (ref 11.7–15.4)
WBC: 7 10*3/uL (ref 3.4–10.8)

## 2019-03-18 LAB — CARDIOVASCULAR RISK ASSESSMENT

## 2019-03-18 LAB — LIPID PANEL
Chol/HDL Ratio: 3.6 ratio (ref 0.0–4.4)
Cholesterol, Total: 197 mg/dL (ref 100–199)
HDL: 54 mg/dL (ref >39)
LDL Chol Calc (NIH): 121 mg/dL — ABNORMAL HIGH (ref 0–99)
Triglycerides: 124 mg/dL (ref 0–149)
VLDL Cholesterol Cal: 22 mg/dL (ref 5–40)

## 2019-03-18 LAB — TSH: TSH: 0.983 u[IU]/mL (ref 0.450–4.500)

## 2019-03-18 MED ORDER — REPATHA SURECLICK 140 MG/ML ~~LOC~~ SOAJ: 140.0000 mg | SUBCUTANEOUS | 5 refills | Status: DC

## 2019-03-28 ENCOUNTER — Other Ambulatory Visit: Payer: Self-pay | Admitting: Physician Assistant

## 2019-03-28 MED ORDER — LISINOPRIL-HYDROCHLOROTHIAZIDE 20-12.5 MG PO TABS: 1.0000 | ORAL_TABLET | Freq: Every day | ORAL | 1 refills | Status: DC

## 2019-03-28 MED ORDER — MELOXICAM 15 MG PO TABS: 15.0000 mg | ORAL_TABLET | Freq: Every day | ORAL | 1 refills | Status: DC

## 2019-04-11 ENCOUNTER — Other Ambulatory Visit: Payer: Self-pay | Admitting: Physician Assistant

## 2019-04-11 MED ORDER — ALPRAZOLAM 0.5 MG PO TBDP: 0.5000 mg | ORAL_TABLET | Freq: Two times a day (BID) | ORAL | 0 refills | Status: DC | PRN

## 2019-05-01 ENCOUNTER — Other Ambulatory Visit: Payer: Self-pay | Admitting: Family Medicine

## 2019-05-02 ENCOUNTER — Other Ambulatory Visit: Payer: Self-pay | Admitting: Physician Assistant

## 2019-05-02 MED ORDER — ROSUVASTATIN CALCIUM 10 MG PO TABS: 10.0000 mg | ORAL_TABLET | Freq: Every day | ORAL | 1 refills | Status: DC

## 2019-06-16 ENCOUNTER — Telehealth (INDEPENDENT_AMBULATORY_CARE_PROVIDER_SITE_OTHER): Payer: Managed Care, Other (non HMO) | Admitting: Physician Assistant

## 2019-06-16 ENCOUNTER — Encounter: Payer: Self-pay | Admitting: Physician Assistant

## 2019-06-16 VITALS — BP 132/69 | HR 66 | Temp 97.2°F | Ht 62.0 in | Wt 286.0 lb

## 2019-06-16 DIAGNOSIS — J060 Acute laryngopharyngitis: Secondary | ICD-10-CM | POA: Diagnosis not present

## 2019-06-16 MED ORDER — AZITHROMYCIN 250 MG PO TABS: ORAL_TABLET | ORAL | 0 refills | Status: DC

## 2019-06-16 NOTE — Progress Notes (Signed)
Virtual Visit via Telephone Note   This visit type was conducted due to national recommendations for restrictions regarding the COVID-19 Pandemic (e.g. social distancing) in an effort to limit this patient's exposure and mitigate transmission in our community.  Due to her co-morbid illnesses, this patient is at least at moderate risk for complications without adequate follow up.  This format is felt to be most appropriate for this patient at this time.  The patient did not have access to video technology/had technical difficulties with video requiring transitioning to audio format only (telephone).  All issues noted in this document were discussed and addressed.  No physical exam could be performed with this format.  Patient verbally consented to a telehealth visit.   Date:  06/16/2019   ID:  Joanna [name], DOB 18-Nov-1955, MRN [id number]  Patient Location: Home Provider Location: Office  PCP:  Marge Duncans, PA-C     Chief Complaint:  URI  History of Present Illness:    Joanna [name] is a 64 y.o. female with URI Pt states that for the past several days she has had head and nasal congestion, PND sore throat and ears feel congested - she has tried singulair, mucinex and sudafed She denies fever or flu like symptoms  The patient does not have symptoms concerning for COVID-19 infection (fever, chills, cough, or new shortness of breath).    Past Medical History:  Diagnosis Date  . Angina    "related to stress"  . Arthritis    "fingers"  . Cancer ([place]) 07/2010   left breast  . Difficult intubation    "short neck"  . GERD (gastroesophageal reflux disease)   . H/O hiatal hernia   . Headache(784.0)   . History of migraines 01/26/11   "it's been several years since my last one"  . Hypercholesteremia   . Hypertension    echo done 2012  [facility] in [place]  . PONV (postoperative nausea and vomiting)   . Recurrent upper respiratory infection (URI)    sinus  infection-finishing up antibiotic 01/24/11  . Sleep apnea ~ 2009   sleep study done ; [place]   Past Surgical History:  Procedure Laterality Date  . ABDOMINAL HYSTERECTOMY  1999  . APPENDECTOMY  1999  . BREAST REDUCTION SURGERY  01/25/11   right breast "reduced & lifted"  . BREAST REDUCTION SURGERY  01/25/2011   Procedure: BREAST REDUCTION WITH LIPOSUCTION;  Surgeon: Macon Large;  Location: [facility];  Service: Plastics;  Laterality: Right;  Right Breast Reduction   . BREAST SURGERY  08/2010   left mastectomy-patient still has drainage tube in  . BREAST SURGERY  01/25/11   right reduction & lift  . BUNIONECTOMY  ~ 2009   left  . CARPAL TUNNEL RELEASE  1981-1984   bilaterally  . CHOLECYSTECTOMY    . HAMMER TOE SURGERY  ~ 2009   left  . LATISSIMUS FLAP TO BREAST  12/13/2010   Procedure: LATISSIMUS FLAP TO BREAST;  Surgeon: Macon Large;  Location: [place] [place];  Service: Plastics;  Laterality: Left;  LEFT LATISSIMUS FLAP WITH IMPLANT     Current Meds  Medication Sig  . ALPRAZolam (NIRAVAM) 0.5 MG dissolvable tablet Take 1 tablet (0.5 mg total) by mouth 2 (two) times daily as needed for anxiety.  Marland Kitchen aspirin 81 MG EC tablet Take by mouth.  . Calcium Carbonate-Vit D-Min (CALCIUM 1200 PO) Take 1 tablet by mouth daily.    Marland Kitchen escitalopram (LEXAPRO) 20 MG tablet  Take 1 tablet (20 mg total) by mouth daily.  . Evolocumab (REPATHA SURECLICK) [id number] MG/ML SOAJ Inject 140 mg into the skin 2 (two) times a week.  Marland Kitchen lisinopril-hydrochlorothiazide (ZESTORETIC) 20-12.5 MG tablet Take 1 tablet by mouth daily.  . meloxicam (MOBIC) 15 MG tablet Take 1 tablet (15 mg total) by mouth daily.  . metoprolol succinate (TOPROL-XL) 25 MG 24 hr tablet TAKE 1/2 TABLET BY MOUTH TWICE DAILY  . montelukast (SINGULAIR) 10 MG tablet Take by mouth.  . rosuvastatin (CRESTOR) 10 MG tablet Take 1 tablet (10 mg total) by mouth daily.  . traZODone (DESYREL) 150 MG tablet TAKE 1 TABLET BY MOUTH AT BEDTIME     Allergies:    Penicillins, Demerol [meperidine hcl], Iodinated diagnostic agents, Latex, and Other   Social History   Tobacco Use  . Smoking status: Never Smoker  . Smokeless tobacco: Never Used  Substance Use Topics  . Alcohol use: Yes    Comment: occ.; haven't drank anything since 01/2010 (01/26/11)  . Drug use: No     Family Hx: The patient's family history is not on file.  ROS:   Please see the history of present illness.    All other systems reviewed and are negative.  Labs/Other Tests and Data Reviewed:    Recent Labs: 03/17/2019: ALT 15; BUN 15; Creatinine, Ser 0.56; Hemoglobin 13.4; Platelets 244; Potassium 4.1; Sodium 143; TSH 0.983   Recent Lipid Panel Lab Results  Component Value Date/Time   CHOL 197 03/17/2019 12:08 PM   TRIG 124 03/17/2019 12:08 PM   HDL 54 03/17/2019 12:08 PM   CHOLHDL 3.6 03/17/2019 12:08 PM   LDLCALC 121 (H) 03/17/2019 12:08 PM    Wt Readings from Last 3 Encounters:  06/16/19 286 lb (129.7 kg)  03/17/19 292 lb (132.5 kg)  01/26/11 266 lb 12.1 oz (121 kg)     Objective:    Vital Signs:  BP 132/69 (BP Location: Left Arm, Patient Position: Sitting)   Pulse 66   Temp (!) 97.2 F (36.2 C) (Temporal)   Ht 5\' 2"  (1.575 m)   Wt 286 lb (129.7 kg)   BMI 52.31 kg/m      ASSESSMENT & PLAN:   1 - URI - rx for zpack and continue current meds as directed  COVID-19 Education: The signs and symptoms of COVID-19 were discussed with the patient and how to seek care for testing (follow up with PCP or arrange E-visit). The importance of social distancing was discussed today.  Time:   Today, I have spent 10 minutes with the patient with telehealth technology discussing the above problems.     Medication Adjustments/Labs and Tests Ordered: Current medicines are reviewed at length with the patient today.  Concerns regarding medicines are outlined above.   Tests Ordered: No orders of the defined types were placed in this encounter.   Medication Changes:  Meds ordered this encounter  Medications  . azithromycin (ZITHROMAX) 250 MG tablet    Sig: 2 po day one then 1 po days 2-5    Dispense:  6 tablet    Refill:  0    Order Specific Question:   Supervising Provider    AnswerShelton Silvas    Follow Up:  In Person prn  Signed, Webb Silversmith, PA-C  06/16/2019 1:42 PM    [place]

## 2019-06-16 NOTE — Assessment & Plan Note (Signed)
Continue her decongestants rx for zpack as directed Follow up if symptoms persist/worsen

## 2019-06-17 ENCOUNTER — Other Ambulatory Visit: Payer: Self-pay | Admitting: Physician Assistant

## 2019-06-17 MED ORDER — ALPRAZOLAM 0.5 MG PO TBDP: 0.5000 mg | ORAL_TABLET | Freq: Two times a day (BID) | ORAL | 0 refills | Status: DC | PRN

## 2019-06-24 ENCOUNTER — Other Ambulatory Visit: Payer: Self-pay

## 2019-06-24 MED ORDER — MELOXICAM 15 MG PO TABS: 15.0000 mg | ORAL_TABLET | Freq: Every day | ORAL | 1 refills | Status: DC

## 2019-07-16 ENCOUNTER — Other Ambulatory Visit: Payer: Self-pay | Admitting: Physician Assistant

## 2019-07-16 MED ORDER — ROSUVASTATIN CALCIUM 10 MG PO TABS: 10.0000 mg | ORAL_TABLET | Freq: Every day | ORAL | 1 refills | Status: DC

## 2019-09-05 ENCOUNTER — Other Ambulatory Visit: Payer: Self-pay | Admitting: Physician Assistant

## 2019-09-05 ENCOUNTER — Ambulatory Visit (INDEPENDENT_AMBULATORY_CARE_PROVIDER_SITE_OTHER): Payer: Managed Care, Other (non HMO) | Admitting: Physician Assistant

## 2019-09-05 ENCOUNTER — Encounter: Payer: Self-pay | Admitting: Physician Assistant

## 2019-09-05 ENCOUNTER — Other Ambulatory Visit: Payer: Self-pay

## 2019-09-05 VITALS — BP 124/78 | HR 52 | Temp 97.9°F | Ht 62.0 in | Wt 271.0 lb

## 2019-09-05 DIAGNOSIS — E782 Mixed hyperlipidemia: Secondary | ICD-10-CM | POA: Diagnosis not present

## 2019-09-05 DIAGNOSIS — F419 Anxiety disorder, unspecified: Secondary | ICD-10-CM

## 2019-09-05 DIAGNOSIS — I10 Essential (primary) hypertension: Secondary | ICD-10-CM | POA: Diagnosis not present

## 2019-09-05 MED ORDER — ALPRAZOLAM 0.5 MG PO TABS
0.5000 mg | ORAL_TABLET | Freq: Every evening | ORAL | 1 refills | Status: DC | PRN
Start: 2019-09-05 — End: 2020-02-12

## 2019-09-05 MED ORDER — ALPRAZOLAM 0.5 MG PO TBDP: 0.5000 mg | ORAL_TABLET | Freq: Two times a day (BID) | ORAL | 1 refills | Status: DC | PRN

## 2019-09-05 NOTE — Assessment & Plan Note (Signed)
Continue meds as directed

## 2019-09-05 NOTE — Assessment & Plan Note (Signed)
Well controlled.  ?No changes to medicines.  ?Continue to work on eating a healthy diet and exercise.  ?Labs drawn today.  ?

## 2019-09-05 NOTE — Progress Notes (Signed)
Established Patient Office Visit  Subjective:  Patient ID: Joanna [name], female    DOB: [birth year]  Age: 64 y.o. MRN: [id number]  CC:  Chief Complaint  Patient presents with  . Hypertension    Fasting follow up    HPI [name] presents for follow up hypertension and hyperlipidemia  Mixed hyperlipidemia  Pt presents with hyperlipidemia. . Compliance with treatment has beengoodThe patient is compliant with medications, maintains a low cholesterol diet , follows up as directed , and maintains an exercise regimen . The patient denies experiencing any hypercholesterolemia related symptoms.  Evidenced based information based on history , exam, and other sources has been used  for decision making. Pt currently on repatha and crestor 10mg   Pt presents for follow up of hypertension.  The patient is tolerating the medication well without side effects. Compliance with treatment has been good; including taking medication as directed , maintains a healthy diet and regular exercise regimen , and following up as directed. Patient was evaluated using exam, physical, labs and other information to perform evidence based decision making. Pt currently taking metoprolol 1/2 bid and lisinopril /hctz 20/12.5 qd  Pt with history of anxiety - she is doing well on trazodone and alprazolam - says controls her symptoms well  Past Medical History:  Diagnosis Date  . Angina    "related to stress"  . Arthritis    "fingers"  . Cancer ([place]) 07/2010   left breast  . Difficult intubation    "short neck"  . GERD (gastroesophageal reflux disease)   . H/O hiatal hernia   . Headache(784.0)   . History of migraines 01/26/11   "it's been several years since my last one"  . Hypercholesteremia   . Hypertension    echo done 2012  [facility] in [place]  . PONV (postoperative nausea and vomiting)   . Recurrent upper respiratory infection (URI)    sinus infection-finishing up antibiotic 01/24/11  .  Sleep apnea ~ 2009   sleep study done ; [place]    Past Surgical History:  Procedure Laterality Date  . ABDOMINAL HYSTERECTOMY  1999  . APPENDECTOMY  1999  . BREAST REDUCTION SURGERY  01/25/11   right breast "reduced & lifted"  . BREAST REDUCTION SURGERY  01/25/2011   Procedure: BREAST REDUCTION WITH LIPOSUCTION;  Surgeon: Macon Large;  Location: [facility];  Service: Plastics;  Laterality: Right;  Right Breast Reduction   . BREAST SURGERY  08/2010   left mastectomy-patient still has drainage tube in  . BREAST SURGERY  01/25/11   right reduction & lift  . BUNIONECTOMY  ~ 2009   left  . CARPAL TUNNEL RELEASE  1981-1984   bilaterally  . CHOLECYSTECTOMY    . HAMMER TOE SURGERY  ~ 2009   left  . LATISSIMUS FLAP TO BREAST  12/13/2010   Procedure: LATISSIMUS FLAP TO BREAST;  Surgeon: Macon Large;  Location: [facility];  Service: Plastics;  Laterality: Left;  LEFT LATISSIMUS FLAP WITH IMPLANT    History reviewed. No pertinent family history.  Social History   Socioeconomic History  . Marital status: Married    Spouse name: Not on file  . Number of children: Not on file  . Years of education: Not on file  . Highest education level: Not on file  Occupational History  . Not on file  Tobacco Use  . Smoking status: Never Smoker  . Smokeless tobacco: Never Used  Substance and Sexual Activity  .  Alcohol use: Yes    Comment: occ.; haven't drank anything since 01/2010 (01/26/11)  . Drug use: No  . Sexual activity: Yes  Other Topics Concern  . Not on file  Social History Narrative  . Not on file   Social Determinants of Health   Financial Resource Strain:   . Difficulty of Paying Living Expenses:   Food Insecurity:   . Worried About Charity fundraiser in the Last Year:   . Arboriculturist in the Last Year:   Transportation Needs:   . Film/video editor (Medical):   Marland Kitchen Lack of Transportation (Non-Medical):   Physical Activity:   . Days of Exercise per Week:   . Minutes  of Exercise per Session:   Stress:   . Feeling of Stress :   Social Connections:   . Frequency of Communication with Friends and Family:   . Frequency of Social Gatherings with Friends and Family:   . Attends Religious Services:   . Active Member of Clubs or Organizations:   . Attends Archivist Meetings:   Marland Kitchen Marital Status:   Intimate Partner Violence:   . Fear of Current or Ex-Partner:   . Emotionally Abused:   Marland Kitchen Physically Abused:   . Sexually Abused:      Current Outpatient Medications:  .  ALPRAZolam (NIRAVAM) 0.5 MG dissolvable tablet, Take 1 tablet (0.5 mg total) by mouth 2 (two) times daily as needed for anxiety., Disp: 60 tablet, Rfl: 1 .  aspirin 81 MG EC tablet, Take by mouth., Disp: , Rfl:  .  Calcium Carbonate-Vit D-Min (CALCIUM 1200 PO), Take 1 tablet by mouth daily.  , Disp: , Rfl:  .  escitalopram (LEXAPRO) 20 MG tablet, Take 1 tablet (20 mg total) by mouth daily., Disp: 90 tablet, Rfl: 1 .  Evolocumab (REPATHA SURECLICK) 381 MG/ML SOAJ, Inject 140 mg into the skin 2 (two) times a week., Disp: 2 pen, Rfl: 5 .  lisinopril-hydrochlorothiazide (ZESTORETIC) 20-12.5 MG tablet, Take 1 tablet by mouth daily., Disp: 90 tablet, Rfl: 1 .  meloxicam (MOBIC) 15 MG tablet, Take 1 tablet (15 mg total) by mouth daily., Disp: 90 tablet, Rfl: 1 .  metoprolol succinate (TOPROL-XL) 25 MG 24 hr tablet, TAKE 1/2 TABLET BY MOUTH TWICE DAILY, Disp: , Rfl:  .  montelukast (SINGULAIR) 10 MG tablet, Take by mouth., Disp: , Rfl:  .  rosuvastatin (CRESTOR) 10 MG tablet, Take 1 tablet (10 mg total) by mouth daily., Disp: 30 tablet, Rfl: 1 .  traZODone (DESYREL) 150 MG tablet, TAKE 1 TABLET BY MOUTH AT BEDTIME, Disp: 90 tablet, Rfl: 1   Allergies  Allergen Reactions  . Penicillins Rash  . Demerol [Meperidine Hcl] Nausea And Vomiting  . Iodinated Diagnostic Agents Rash    EATS SHELLFISH WITH NO PROBLEMS   . Latex Rash  . Other Rash    ROS CONSTITUTIONAL: Negative for chills,  fatigue, fever, unintentional weight gain and unintentional weight loss.  E/N/T: negative CARDIOVASCULAR: Negative for chest pain, dizziness, palpitations and pedal edema.  RESPIRATORY: Negative for recent cough and dyspnea.  GASTROINTESTINAL: Negative for abdominal pain, acid reflux symptoms, constipation, diarrhea, nausea and vomiting.  MSK: Negative for arthralgias and myalgias.  INTEGUMENTARY: Negative for rash.  NEUROLOGICAL: Negative for dizziness and headaches.  PSYCHIATRIC: Negative for sleep disturbance and to question depression screen.  Negative for depression, negative for anhedonia.        Objective:    PHYSICAL EXAM:   VS: BP 124/78 (BP  Location: Left Arm, Patient Position: Sitting)   Pulse (!) 52   Temp 97.9 F (36.6 C) (Temporal)   Ht 5\' 2"  (1.575 m)   Wt 271 lb (122.9 kg)   SpO2 97%   BMI 49.57 kg/m   GEN: Well nourished, well developed, in no acute distress  Cardiac: RRR; no murmurs, rubs, or gallops,no edema -  Respiratory:  normal respiratory rate and pattern with no distress - normal breath sounds with no rales, rhonchi, wheezes or rubs  Skin: warm and dry, no rash  Neuro:  Alert and Oriented x 3, Strength and sensation are intact - CN II-Xii grossly intact Psych: euthymic mood, appropriate affect and demeanor    Health Maintenance Due  Topic Date Due  . Hepatitis C Screening  Never done  . HIV Screening  Never done  . INFLUENZA VACCINE  08/31/2019    There are no preventive care reminders to display for this patient.  Lab Results  Component Value Date   TSH 0.983 03/17/2019   Lab Results  Component Value Date   WBC 7.0 03/17/2019   HGB 13.4 03/17/2019   HCT 40.6 03/17/2019   MCV 89 03/17/2019   PLT 244 03/17/2019   Lab Results  Component Value Date   NA 143 03/17/2019   K 4.1 03/17/2019   CO2 25 03/17/2019   GLUCOSE 79 03/17/2019   BUN 15 03/17/2019   CREATININE 0.56 (L) 03/17/2019   BILITOT 0.4 03/17/2019   ALKPHOS 95 03/17/2019    AST 17 03/17/2019   ALT 15 03/17/2019   PROT 6.8 03/17/2019   ALBUMIN 4.5 03/17/2019   CALCIUM 9.4 03/17/2019   Lab Results  Component Value Date   CHOL 197 03/17/2019   Lab Results  Component Value Date   HDL 54 03/17/2019   Lab Results  Component Value Date   LDLCALC 121 (H) 03/17/2019   Lab Results  Component Value Date   TRIG 124 03/17/2019   Lab Results  Component Value Date   CHOLHDL 3.6 03/17/2019   No results found for: HGBA1C    Assessment & Plan:   Problem List Items Addressed This Visit      Cardiovascular and Mediastinum   Benign hypertension - Primary    Well controlled.  No changes to medicines.  Continue to work on eating a healthy diet and exercise.  Labs drawn today.        Relevant Orders   CBC with Differential/Platelet   Comprehensive metabolic panel   TSH   Lipid panel     Other   Anxiety    Continue meds as directed      Relevant Medications   ALPRAZolam (NIRAVAM) 0.5 MG dissolvable tablet   Mixed hyperlipidemia    Well controlled.  No changes to medicines.  Continue to work on eating a healthy diet and exercise.  Labs drawn today.        Relevant Orders   Lipid panel      Meds ordered this encounter  Medications  . ALPRAZolam (NIRAVAM) 0.5 MG dissolvable tablet    Sig: Take 1 tablet (0.5 mg total) by mouth 2 (two) times daily as needed for anxiety.    Dispense:  60 tablet    Refill:  1    Order Specific Question:   Supervising Provider    Answer:   Shelton Silvas    Follow-up: Return in about 6 months (around 03/07/2020) for chronic fasting follow up.    SARA R Cayce Paschal, PA-C

## 2019-09-06 LAB — CBC WITH DIFFERENTIAL/PLATELET
Basophils Absolute: 0 10*3/uL (ref 0.0–0.2)
Basos: 1 %
EOS (ABSOLUTE): 0.4 10*3/uL (ref 0.0–0.4)
Eos: 6 %
Hematocrit: 40.5 % (ref 34.0–46.6)
Hemoglobin: 13.3 g/dL (ref 11.1–15.9)
Immature Grans (Abs): 0 10*3/uL (ref 0.0–0.1)
Immature Granulocytes: 0 %
Lymphocytes Absolute: 1.9 10*3/uL (ref 0.7–3.1)
Lymphs: 27 %
MCH: 29.4 pg (ref 26.6–33.0)
MCHC: 32.8 g/dL (ref 31.5–35.7)
MCV: 89 fL (ref 79–97)
Monocytes Absolute: 0.5 10*3/uL (ref 0.1–0.9)
Monocytes: 7 %
Neutrophils Absolute: 4.1 10*3/uL (ref 1.4–7.0)
Neutrophils: 59 %
Platelets: 237 10*3/uL (ref 150–450)
RBC: 4.53 x10E6/uL (ref 3.77–5.28)
RDW: 12.6 % (ref 11.7–15.4)
WBC: 7 10*3/uL (ref 3.4–10.8)

## 2019-09-06 LAB — COMPREHENSIVE METABOLIC PANEL
ALT: 14 IU/L (ref 0–32)
AST: 18 IU/L (ref 0–40)
Albumin/Globulin Ratio: 1.8 (ref 1.2–2.2)
Albumin: 4.5 g/dL (ref 3.8–4.8)
Alkaline Phosphatase: 97 IU/L (ref 48–121)
BUN/Creatinine Ratio: 18 (ref 12–28)
BUN: 13 mg/dL (ref 8–27)
Bilirubin Total: 0.5 mg/dL (ref 0.0–1.2)
CO2: 23 mmol/L (ref 20–29)
Calcium: 9.6 mg/dL (ref 8.7–10.3)
Chloride: 103 mmol/L (ref 96–106)
Creatinine, Ser: 0.71 mg/dL (ref 0.57–1.00)
GFR calc Af Amer: 106 mL/min/{1.73_m2} (ref >59)
GFR calc non Af Amer: 92 mL/min/{1.73_m2} (ref >59)
Globulin, Total: 2.5 g/dL (ref 1.5–4.5)
Glucose: 92 mg/dL (ref 65–99)
Potassium: 4.4 mmol/L (ref 3.5–5.2)
Sodium: 141 mmol/L (ref 134–144)
Total Protein: 7 g/dL (ref 6.0–8.5)

## 2019-09-06 LAB — LIPID PANEL
Chol/HDL Ratio: 3.9 ratio (ref 0.0–4.4)
Cholesterol, Total: 215 mg/dL — ABNORMAL HIGH (ref 100–199)
HDL: 55 mg/dL (ref >39)
LDL Chol Calc (NIH): 138 mg/dL — ABNORMAL HIGH (ref 0–99)
Triglycerides: 126 mg/dL (ref 0–149)
VLDL Cholesterol Cal: 22 mg/dL (ref 5–40)

## 2019-09-06 LAB — TSH: TSH: 0.812 u[IU]/mL (ref 0.450–4.500)

## 2019-09-06 LAB — CARDIOVASCULAR RISK ASSESSMENT

## 2019-09-08 ENCOUNTER — Other Ambulatory Visit: Payer: Self-pay

## 2019-09-08 MED ORDER — ROSUVASTATIN CALCIUM 20 MG PO TABS: 20.0000 mg | ORAL_TABLET | Freq: Every day | ORAL | 2 refills | Status: DC

## 2019-09-15 ENCOUNTER — Other Ambulatory Visit: Payer: Self-pay | Admitting: Physician Assistant

## 2019-09-15 ENCOUNTER — Ambulatory Visit: Payer: Managed Care, Other (non HMO) | Admitting: Physician Assistant

## 2019-09-15 MED ORDER — ESCITALOPRAM OXALATE 20 MG PO TABS: 20.0000 mg | ORAL_TABLET | Freq: Every day | ORAL | 1 refills | Status: DC

## 2019-10-31 LAB — HM MAMMOGRAPHY

## 2019-11-04 DIAGNOSIS — D0512 Intraductal carcinoma in situ of left breast: Secondary | ICD-10-CM

## 2019-11-17 ENCOUNTER — Other Ambulatory Visit: Payer: Self-pay | Admitting: Family Medicine

## 2020-01-20 ENCOUNTER — Ambulatory Visit: Payer: Managed Care, Other (non HMO) | Admitting: Physician Assistant

## 2020-02-12 ENCOUNTER — Other Ambulatory Visit: Payer: Self-pay | Admitting: Physician Assistant

## 2020-02-12 MED ORDER — ALPRAZOLAM 0.5 MG PO TABS: 0.5000 mg | ORAL_TABLET | Freq: Every evening | ORAL | 0 refills | Status: DC | PRN

## 2020-02-12 MED ORDER — LISINOPRIL-HYDROCHLOROTHIAZIDE 20-12.5 MG PO TABS: 1.0000 | ORAL_TABLET | Freq: Every day | ORAL | 0 refills | Status: DC

## 2020-03-08 ENCOUNTER — Ambulatory Visit: Payer: Managed Care, Other (non HMO) | Admitting: Physician Assistant

## 2020-03-12 ENCOUNTER — Encounter: Payer: Self-pay | Admitting: Physician Assistant

## 2020-03-12 ENCOUNTER — Ambulatory Visit: Payer: Managed Care, Other (non HMO) | Admitting: Physician Assistant

## 2020-03-12 ENCOUNTER — Other Ambulatory Visit: Payer: Self-pay

## 2020-03-12 VITALS — BP 122/78 | HR 52 | Temp 97.2°F | Ht 62.0 in | Wt 272.4 lb

## 2020-03-12 DIAGNOSIS — E782 Mixed hyperlipidemia: Secondary | ICD-10-CM

## 2020-03-12 DIAGNOSIS — F419 Anxiety disorder, unspecified: Secondary | ICD-10-CM

## 2020-03-12 DIAGNOSIS — Z23 Encounter for immunization: Secondary | ICD-10-CM

## 2020-03-12 DIAGNOSIS — J3089 Other allergic rhinitis: Secondary | ICD-10-CM

## 2020-03-12 DIAGNOSIS — I10 Essential (primary) hypertension: Secondary | ICD-10-CM | POA: Diagnosis not present

## 2020-03-12 MED ORDER — ROSUVASTATIN CALCIUM 20 MG PO TABS: 20.0000 mg | ORAL_TABLET | Freq: Every day | ORAL | 1 refills | Status: DC

## 2020-03-12 MED ORDER — ALPRAZOLAM 0.5 MG PO TABS: 0.5000 mg | ORAL_TABLET | Freq: Every evening | ORAL | 1 refills | Status: DC | PRN

## 2020-03-12 MED ORDER — MONTELUKAST SODIUM 10 MG PO TABS: 10.0000 mg | ORAL_TABLET | Freq: Every day | ORAL | 3 refills | Status: DC

## 2020-03-12 NOTE — Progress Notes (Signed)
Established Patient Office Visit  Subjective:  Patient ID: Joanna [name], female    DOB: 1955/10/06  Age: 65 y.o. MRN: [id number]  CC:  Chief Complaint  Patient presents with  . Hypertension    39M follow up    HPI [name] presents for follow up hypertension and hyperlipidemia  Mixed hyperlipidemia  Pt presents with hyperlipidemia. . Compliance with treatment has beengoodThe patient is compliant with medications, maintains a low cholesterol diet , follows up as directed , and maintains an exercise regimen . The patient denies experiencing any hypercholesterolemia related symptoms. . Pt currently on crestor 20mg  but no longer on repatha  Pt presents for follow up of hypertension.  The patient is tolerating the medication well without side effects. Compliance with treatment has been good; including taking medication as directed , maintains a healthy diet and regular exercise regimen , and following up as directed. . Pt currently taking metoprolol 1/2 bid and lisinopril /hctz 20/12.5 qd- bp good today  Pt with history of anxiety - she is doing well on trazodone, lexapro and alprazolam - says controls her symptoms well - she has had a new job change and thinks that will help her greatly  Pt with history of allergies - would like refill of singulair Past Medical History:  Diagnosis Date  . Angina    "related to stress"  . Arthritis    "fingers"  . Cancer ([place]) 07/2010   left breast  . Difficult intubation    "short neck"  . GERD (gastroesophageal reflux disease)   . H/O hiatal hernia   . Headache(784.0)   . History of migraines 01/26/11   "it's been several years since my last one"  . Hypercholesteremia   . Hypertension    echo done 2012  [facility] in [place]  . PONV (postoperative nausea and vomiting)   . Recurrent upper respiratory infection (URI)    sinus infection-finishing up antibiotic 01/24/11  . Sleep apnea ~ 2009   sleep study done ; [place]     Past Surgical History:  Procedure Laterality Date  . ABDOMINAL HYSTERECTOMY  1999  . APPENDECTOMY  1999  . BREAST REDUCTION SURGERY  01/25/11   right breast "reduced & lifted"  . BREAST REDUCTION SURGERY  01/25/2011   Procedure: BREAST REDUCTION WITH LIPOSUCTION;  Surgeon: Macon Large;  Location: [facility];  Service: Plastics;  Laterality: Right;  Right Breast Reduction   . BREAST SURGERY  08/2010   left mastectomy-patient still has drainage tube in  . BREAST SURGERY  01/25/11   right reduction & lift  . BUNIONECTOMY  ~ 2009   left  . CARPAL TUNNEL RELEASE  1981-1984   bilaterally  . CHOLECYSTECTOMY    . HAMMER TOE SURGERY  ~ 2009   left  . LATISSIMUS FLAP TO BREAST  12/13/2010   Procedure: LATISSIMUS FLAP TO BREAST;  Surgeon: Macon Large;  Location: [facility];  Service: Plastics;  Laterality: Left;  LEFT LATISSIMUS FLAP WITH IMPLANT    History reviewed. No pertinent family history.  Social History   Socioeconomic History  . Marital status: Married    Spouse name: Not on file  . Number of children: Not on file  . Years of education: Not on file  . Highest education level: Not on file  Occupational History  . Not on file  Tobacco Use  . Smoking status: Never Smoker  . Smokeless tobacco: Never Used  Substance and Sexual Activity  .  Alcohol use: Yes    Comment: occ.; haven't drank anything since 01/2010 (01/26/11)  . Drug use: No  . Sexual activity: Yes  Other Topics Concern  . Not on file  Social History Narrative  . Not on file   Social Determinants of Health   Financial Resource Strain: Not on file  Food Insecurity: Not on file  Transportation Needs: Not on file  Physical Activity: Not on file  Stress: Not on file  Social Connections: Not on file  Intimate Partner Violence: Not on file     Current Outpatient Medications:  .  aspirin 81 MG EC tablet, Take by mouth., Disp: , Rfl:  .  Calcium Carbonate-Vit D-Min (CALCIUM 1200 PO), Take 1 tablet by mouth  daily., Disp: , Rfl:  .  Cholecalciferol 25 MCG (1000 UT) capsule, Take by mouth., Disp: , Rfl:  .  escitalopram (LEXAPRO) 20 MG tablet, Take 1 tablet (20 mg total) by mouth daily., Disp: 90 tablet, Rfl: 1 .  lisinopril-hydrochlorothiazide (ZESTORETIC) 20-12.5 MG tablet, Take 1 tablet by mouth daily., Disp: 90 tablet, Rfl: 0 .  meloxicam (MOBIC) 15 MG tablet, Take 1 tablet (15 mg total) by mouth daily., Disp: 90 tablet, Rfl: 1 .  metoprolol succinate (TOPROL-XL) 25 MG 24 hr tablet, TAKE 1/2 TABLET BY MOUTH TWICE DAILY, Disp: , Rfl:  .  traZODone (DESYREL) 150 MG tablet, TAKE 1 TABLET BY MOUTH AT BEDTIME, Disp: 90 tablet, Rfl: 1 .  ALPRAZolam (XANAX) 0.5 MG tablet, Take 1 tablet (0.5 mg total) by mouth at bedtime as needed for anxiety., Disp: 30 tablet, Rfl: 1 .  montelukast (SINGULAIR) 10 MG tablet, Take 1 tablet (10 mg total) by mouth at bedtime., Disp: 90 tablet, Rfl: 3 .  rosuvastatin (CRESTOR) 20 MG tablet, Take 1 tablet (20 mg total) by mouth daily., Disp: 90 tablet, Rfl: 1   Allergies  Allergen Reactions  . Iodinated Diagnostic Agents Rash    EATS SHELLFISH WITH NO PROBLEMS  EATS SHELLFISH WITH NO PROBLEMS EATS SHELLFISH WITH NO PROBLEMS  . Latex Rash  . Meperidine Hcl Nausea And Vomiting  . Penicillins Rash  . Shellfish Allergy Rash  . Demerol [Meperidine Hcl] Nausea And Vomiting  . Other Rash    ROS CONSTITUTIONAL: Negative for chills, fatigue, fever, unintentional weight gain and unintentional weight loss.  E/N/T: Negative for ear pain, nasal congestion and sore throat.  CARDIOVASCULAR: Negative for chest pain, dizziness, palpitations and pedal edema.  RESPIRATORY: Negative for recent cough and dyspnea.  GASTROINTESTINAL: Negative for abdominal pain, acid reflux symptoms, constipation, diarrhea, nausea and vomiting.  MSK: Negative for arthralgias and myalgias.  INTEGUMENTARY: Negative for rash.  NEUROLOGICAL: Negative for dizziness and headaches.  PSYCHIATRIC: Negative  for sleep disturbance and to question depression screen.  Negative for depression, negative for anhedonia.            Objective:    PHYSICAL EXAM:   VS: BP 122/78 (BP Location: Left Arm, Patient Position: Sitting, Cuff Size: Large)   Pulse (!) 52   Temp (!) 97.2 F (36.2 C) (Temporal)   Ht 5\' 2"  (1.575 m)   Wt 272 lb 6.4 oz (123.6 kg)   SpO2 99%   BMI 49.82 kg/m   PHYSICAL EXAM:   VS: BP 122/78 (BP Location: Left Arm, Patient Position: Sitting, Cuff Size: Large)   Pulse (!) 52   Temp (!) 97.2 F (36.2 C) (Temporal)   Ht 5\' 2"  (1.575 m)   Wt 272 lb 6.4 oz (123.6 kg)  SpO2 99%   BMI 49.82 kg/m   GEN: Well nourished, well developed, in no acute distress  Cardiac: RRR; no murmurs, rubs, or gallops,no edema - no significant varicosities Respiratory:  normal respiratory rate and pattern with no distress - normal breath sounds with no rales, rhonchi, wheezes or rubs Skin: warm and dry, no rash  Neuro:  Alert and Oriented x 3, Strength and sensation are intact - CN II-Xii grossly intact Psych: euthymic mood, appropriate affect and demeanor     Health Maintenance Due  Topic Date Due  . COVID-19 Vaccine (3 - Pfizer risk 4-dose series) 07/01/2019  . INFLUENZA VACCINE  08/31/2019  . MAMMOGRAM  10/14/2019    There are no preventive care reminders to display for this patient.  Lab Results  Component Value Date   TSH 0.812 09/05/2019   Lab Results  Component Value Date   WBC 7.0 09/05/2019   HGB 13.3 09/05/2019   HCT 40.5 09/05/2019   MCV 89 09/05/2019   PLT 237 09/05/2019   Lab Results  Component Value Date   NA 141 09/05/2019   K 4.4 09/05/2019   CO2 23 09/05/2019   GLUCOSE 92 09/05/2019   BUN 13 09/05/2019   CREATININE 0.71 09/05/2019   BILITOT 0.5 09/05/2019   ALKPHOS 97 09/05/2019   AST 18 09/05/2019   ALT 14 09/05/2019   PROT 7.0 09/05/2019   ALBUMIN 4.5 09/05/2019   CALCIUM 9.6 09/05/2019   Lab Results  Component Value Date   CHOL 215 (H)  09/05/2019   Lab Results  Component Value Date   HDL 55 09/05/2019   Lab Results  Component Value Date   LDLCALC 138 (H) 09/05/2019   Lab Results  Component Value Date   TRIG 126 09/05/2019   Lab Results  Component Value Date   CHOLHDL 3.9 09/05/2019   No results found for: HGBA1C    Assessment & Plan:   Problem List Items Addressed This Visit      Cardiovascular and Mediastinum   Benign hypertension - Primary   Relevant Medications   rosuvastatin (CRESTOR) 20 MG tablet   Other Relevant Orders   CBC with Differential/Platelet   Comprehensive metabolic panel   TSH     Other   Anxiety   Relevant Medications   ALPRAZolam (XANAX) 0.5 MG tablet   Mixed hyperlipidemia   Relevant Medications   rosuvastatin (CRESTOR) 20 MG tablet   Other Relevant Orders   Lipid panel    Other Visit Diagnoses    Need for prophylactic vaccination and inoculation against influenza       Relevant Orders   Flu Vaccine MDCK QUAD PF   Seasonal allergic rhinitis due to other allergic trigger       Relevant Medications   montelukast (SINGULAIR) 10 MG tablet      Meds ordered this encounter  Medications  . ALPRAZolam (XANAX) 0.5 MG tablet    Sig: Take 1 tablet (0.5 mg total) by mouth at bedtime as needed for anxiety.    Dispense:  30 tablet    Refill:  1    Order Specific Question:   Supervising Provider    AnswerRochel Brome [id number]  . montelukast (SINGULAIR) 10 MG tablet    Sig: Take 1 tablet (10 mg total) by mouth at bedtime.    Dispense:  90 tablet    Refill:  3    Order Specific Question:   Supervising Provider    Answer:   Rochel Brome [  [id number]]  . rosuvastatin (CRESTOR) 20 MG tablet    Sig: Take 1 tablet (20 mg total) by mouth daily.    Dispense:  90 tablet    Refill:  1    Order Specific Question:   Supervising Provider    Answer:   Shelton Silvas    Follow-up: Return in about 6 months (around 09/09/2020) for chronic fasting follow up.    SARA R Neil Brickell,  PA-C

## 2020-03-13 LAB — CBC WITH DIFFERENTIAL/PLATELET
Basophils Absolute: 0.1 10*3/uL (ref 0.0–0.2)
Basos: 1 %
EOS (ABSOLUTE): 0.4 10*3/uL (ref 0.0–0.4)
Eos: 5 %
Hematocrit: 41.6 % (ref 34.0–46.6)
Hemoglobin: 13.9 g/dL (ref 11.1–15.9)
Immature Grans (Abs): 0 10*3/uL (ref 0.0–0.1)
Immature Granulocytes: 1 %
Lymphocytes Absolute: 1.9 10*3/uL (ref 0.7–3.1)
Lymphs: 24 %
MCH: 28.8 pg (ref 26.6–33.0)
MCHC: 33.4 g/dL (ref 31.5–35.7)
MCV: 86 fL (ref 79–97)
Monocytes Absolute: 0.6 10*3/uL (ref 0.1–0.9)
Monocytes: 7 %
Neutrophils Absolute: 5.1 10*3/uL (ref 1.4–7.0)
Neutrophils: 62 %
Platelets: 271 10*3/uL (ref 150–450)
RBC: 4.83 x10E6/uL (ref 3.77–5.28)
RDW: 12.6 % (ref 11.7–15.4)
WBC: 8.1 10*3/uL (ref 3.4–10.8)

## 2020-03-13 LAB — TSH: TSH: 1.19 u[IU]/mL (ref 0.450–4.500)

## 2020-03-13 LAB — COMPREHENSIVE METABOLIC PANEL
ALT: 26 IU/L (ref 0–32)
AST: 31 IU/L (ref 0–40)
Albumin/Globulin Ratio: 1.7 (ref 1.2–2.2)
Albumin: 4.4 g/dL (ref 3.8–4.8)
Alkaline Phosphatase: 117 IU/L (ref 44–121)
BUN/Creatinine Ratio: 17 (ref 12–28)
BUN: 12 mg/dL (ref 8–27)
Bilirubin Total: 0.7 mg/dL (ref 0.0–1.2)
CO2: 22 mmol/L (ref 20–29)
Calcium: 10 mg/dL (ref 8.7–10.3)
Chloride: 99 mmol/L (ref 96–106)
Creatinine, Ser: 0.69 mg/dL (ref 0.57–1.00)
GFR calc Af Amer: 107 mL/min/{1.73_m2} (ref >59)
GFR calc non Af Amer: 93 mL/min/{1.73_m2} (ref >59)
Globulin, Total: 2.6 g/dL (ref 1.5–4.5)
Glucose: 89 mg/dL (ref 65–99)
Potassium: 4.6 mmol/L (ref 3.5–5.2)
Sodium: 140 mmol/L (ref 134–144)
Total Protein: 7 g/dL (ref 6.0–8.5)

## 2020-03-13 LAB — LIPID PANEL
Chol/HDL Ratio: 3.8 ratio (ref 0.0–4.4)
Cholesterol, Total: 201 mg/dL — ABNORMAL HIGH (ref 100–199)
HDL: 53 mg/dL (ref >39)
LDL Chol Calc (NIH): 129 mg/dL — ABNORMAL HIGH (ref 0–99)
Triglycerides: 107 mg/dL (ref 0–149)
VLDL Cholesterol Cal: 19 mg/dL (ref 5–40)

## 2020-03-13 LAB — CARDIOVASCULAR RISK ASSESSMENT

## 2020-03-18 ENCOUNTER — Other Ambulatory Visit: Payer: Self-pay | Admitting: Physician Assistant

## 2020-03-18 MED ORDER — MELOXICAM 15 MG PO TABS: 15.0000 mg | ORAL_TABLET | Freq: Every day | ORAL | 1 refills | Status: DC

## 2020-03-31 ENCOUNTER — Other Ambulatory Visit: Payer: Self-pay | Admitting: Physician Assistant

## 2020-03-31 MED ORDER — ESCITALOPRAM OXALATE 20 MG PO TABS: 20.0000 mg | ORAL_TABLET | Freq: Every day | ORAL | 0 refills | Status: DC

## 2020-05-18 ENCOUNTER — Other Ambulatory Visit: Payer: Self-pay | Admitting: Physician Assistant

## 2020-05-18 MED ORDER — LISINOPRIL-HYDROCHLOROTHIAZIDE 20-12.5 MG PO TABS: 1.0000 | ORAL_TABLET | Freq: Every day | ORAL | 0 refills | Status: DC

## 2020-06-04 ENCOUNTER — Other Ambulatory Visit: Payer: Self-pay | Admitting: Family Medicine

## 2020-06-24 ENCOUNTER — Ambulatory Visit: Payer: Managed Care, Other (non HMO) | Admitting: Legal Medicine

## 2020-06-25 ENCOUNTER — Ambulatory Visit: Payer: Managed Care, Other (non HMO) | Admitting: Legal Medicine

## 2020-06-25 ENCOUNTER — Encounter: Payer: Self-pay | Admitting: Legal Medicine

## 2020-06-25 ENCOUNTER — Other Ambulatory Visit: Payer: Self-pay

## 2020-06-25 DIAGNOSIS — F419 Anxiety disorder, unspecified: Secondary | ICD-10-CM

## 2020-06-25 DIAGNOSIS — S90851A Superficial foreign body, right foot, initial encounter: Secondary | ICD-10-CM | POA: Diagnosis not present

## 2020-06-25 MED ORDER — ESCITALOPRAM OXALATE 20 MG PO TABS: 20.0000 mg | ORAL_TABLET | Freq: Every day | ORAL | 0 refills | Status: DC

## 2020-06-25 MED ORDER — ALPRAZOLAM 0.5 MG PO TABS: 0.5000 mg | ORAL_TABLET | Freq: Every evening | ORAL | 1 refills | Status: DC | PRN

## 2020-06-25 NOTE — Progress Notes (Signed)
Subjective:  Patient ID: Joanna [name], female    DOB: 11/20/1955  Age: 65 y.o. MRN: [id number]  Chief Complaint  Patient presents with  . Foot Injury    HPI: right foot pain after stepping on some plants with thorns one week ago. She has pain on walking, no redness.   Current Outpatient Medications on File Prior to Visit  Medication Sig Dispense Refill  . aspirin 81 MG EC tablet Take by mouth.    . Calcium Carbonate-Vit D-Min (CALCIUM 1200 PO) Take 1 tablet by mouth daily.    . Cholecalciferol 25 MCG (1000 UT) capsule Take by mouth.    Marland Kitchen lisinopril-hydrochlorothiazide (ZESTORETIC) 20-12.5 MG tablet Take 1 tablet by mouth daily. 90 tablet 0  . meloxicam (MOBIC) 15 MG tablet Take 1 tablet (15 mg total) by mouth daily. 90 tablet 1  . metoprolol succinate (TOPROL-XL) 25 MG 24 hr tablet TAKE 1/2 TABLET BY MOUTH TWICE DAILY    . montelukast (SINGULAIR) 10 MG tablet Take 1 tablet (10 mg total) by mouth at bedtime. 90 tablet 3  . rosuvastatin (CRESTOR) 20 MG tablet Take 1 tablet (20 mg total) by mouth daily. 90 tablet 1  . traZODone (DESYREL) 150 MG tablet TAKE 1 TABLET BY MOUTH AT BEDTIME 90 tablet 1   No current facility-administered medications on file prior to visit.   Past Medical History:  Diagnosis Date  . Angina    "related to stress"  . Arthritis    "fingers"  . Cancer ([facility]) 07/2010   left breast  . Difficult intubation    "short neck"  . GERD (gastroesophageal reflux disease)   . H/O hiatal hernia   . Headache(784.0)   . History of migraines 01/26/11   "it's been several years since my last one"  . Hypercholesteremia   . Hypertension    echo done 2012  [facility] in [place]  . PONV (postoperative nausea and vomiting)   . Recurrent upper respiratory infection (URI)    sinus infection-finishing up antibiotic 01/24/11  . Sleep apnea ~ 2009   sleep study done ; [place]   Past Surgical History:  Procedure Laterality Date  . ABDOMINAL HYSTERECTOMY  1999  .  APPENDECTOMY  1999  . BREAST REDUCTION SURGERY  01/25/11   right breast "reduced & lifted"  . BREAST REDUCTION SURGERY  01/25/2011   Procedure: BREAST REDUCTION WITH LIPOSUCTION;  Surgeon: Macon Large;  Location: [place];  Service: Plastics;  Laterality: Right;  Right Breast Reduction   . BREAST SURGERY  08/2010   left mastectomy-patient still has drainage tube in  . BREAST SURGERY  01/25/11   right reduction & lift  . BUNIONECTOMY  ~ 2009   left  . CARPAL TUNNEL RELEASE  1981-1984   bilaterally  . CHOLECYSTECTOMY    . HAMMER TOE SURGERY  ~ 2009   left  . LATISSIMUS FLAP TO BREAST  12/13/2010   Procedure: LATISSIMUS FLAP TO BREAST;  Surgeon: Macon Large;  Location: [place];  Service: Plastics;  Laterality: Left;  LEFT LATISSIMUS FLAP WITH IMPLANT    History reviewed. No pertinent family history. Social History   Socioeconomic History  . Marital status: Married    Spouse name: Not on file  . Number of children: Not on file  . Years of education: Not on file  . Highest education level: Not on file  Occupational History  . Not on file  Tobacco Use  . Smoking status: Never Smoker  .  Smokeless tobacco: Never Used  Substance and Sexual Activity  . Alcohol use: Yes    Comment: occ.; haven't drank anything since 01/2010 (01/26/11)  . Drug use: No  . Sexual activity: Yes  Other Topics Concern  . Not on file  Social History Narrative  . Not on file   Social Determinants of Health   Financial Resource Strain: Not on file  Food Insecurity: Not on file  Transportation Needs: Not on file  Physical Activity: Not on file  Stress: Not on file  Social Connections: Not on file    Review of Systems  Constitutional: Negative for activity change and appetite change.  HENT: Negative.   Respiratory: Negative for chest tightness and shortness of breath.   Cardiovascular: Negative for chest pain, palpitations and leg swelling.  Gastrointestinal: Negative for abdominal distention.   Genitourinary: Negative.   Musculoskeletal: Negative for arthralgias.  Skin: Negative.        Thorn in plantar aspect of foot  Psychiatric/Behavioral: Negative.      Objective:  BP 130/70 (BP Location: Right Arm, Patient Position: Sitting, Cuff Size: Large)   Pulse 60   Temp 97.8 F (36.6 C) (Temporal)   Ht 5\' 2"  (1.575 m)   Wt 277 lb (125.6 kg)   SpO2 94%   BMI 50.66 kg/m   BP/Weight 06/25/2020 9/62/2297 10/07/9209  Systolic BP 941 740 814  Diastolic BP 70 78 78  Wt. (Lbs) 277 272.4 271  BMI 50.66 49.82 49.57    Physical Exam Vitals reviewed.  Constitutional:      Appearance: Normal appearance.  Eyes:     Extraocular Movements: Extraocular movements intact.     Conjunctiva/sclera: Conjunctivae normal.     Pupils: Pupils are equal, round, and reactive to light.  Cardiovascular:     Rate and Rhythm: Normal rate and regular rhythm.     Pulses: Normal pulses.     Heart sounds: No murmur heard. No gallop.   Pulmonary:     Effort: Pulmonary effort is normal.     Breath sounds: Normal breath sounds.  Musculoskeletal:     Cervical back: Normal range of motion and neck supple.  Skin:    Findings: Lesion present.     Comments: Small thorn in heel of right foot  Neurological:     General: No focal deficit present.     Mental Status: She is alert and oriented to person, place, and time.       Lab Results  Component Value Date   WBC 8.1 03/12/2020   HGB 13.9 03/12/2020   HCT 41.6 03/12/2020   PLT 271 03/12/2020   GLUCOSE 89 03/12/2020   CHOL 201 (H) 03/12/2020   TRIG 107 03/12/2020   HDL 53 03/12/2020   LDLCALC 129 (H) 03/12/2020   ALT 26 03/12/2020   AST 31 03/12/2020   NA 140 03/12/2020   K 4.6 03/12/2020   CL 99 03/12/2020   CREATININE 0.69 03/12/2020   BUN 12 03/12/2020   CO2 22 03/12/2020   TSH 1.190 03/12/2020      Assessment & Plan:   Diagnoses and all orders for this visit: Foreign body in right foot, initial encounter  Procedure: the  plantar aspect of foot was cleaned with alcohol and under direct visualization, small thorn was removed.  No further pain        Follow-up: Return if symptoms worsen or fail to improve.  An After Visit Summary was printed and given to the patient.  Reinaldo Meeker,  MD [place] [phone]

## 2020-09-01 ENCOUNTER — Other Ambulatory Visit: Payer: Self-pay | Admitting: Physician Assistant

## 2020-09-01 DIAGNOSIS — F419 Anxiety disorder, unspecified: Secondary | ICD-10-CM

## 2020-09-01 MED ORDER — ALPRAZOLAM 0.5 MG PO TABS: 0.5000 mg | ORAL_TABLET | Freq: Every evening | ORAL | 0 refills | Status: DC | PRN

## 2020-09-09 ENCOUNTER — Ambulatory Visit: Payer: Managed Care, Other (non HMO) | Admitting: Physician Assistant

## 2020-09-09 ENCOUNTER — Other Ambulatory Visit: Payer: Self-pay

## 2020-09-09 ENCOUNTER — Encounter: Payer: Self-pay | Admitting: Physician Assistant

## 2020-09-09 VITALS — BP 130/78 | HR 61 | Temp 97.2°F | Ht 62.0 in | Wt 294.0 lb

## 2020-09-09 DIAGNOSIS — I10 Essential (primary) hypertension: Secondary | ICD-10-CM | POA: Diagnosis not present

## 2020-09-09 DIAGNOSIS — E559 Vitamin D deficiency, unspecified: Secondary | ICD-10-CM

## 2020-09-09 DIAGNOSIS — R3589 Other polyuria: Secondary | ICD-10-CM

## 2020-09-09 DIAGNOSIS — F419 Anxiety disorder, unspecified: Secondary | ICD-10-CM | POA: Diagnosis not present

## 2020-09-09 DIAGNOSIS — Z23 Encounter for immunization: Secondary | ICD-10-CM

## 2020-09-09 DIAGNOSIS — E782 Mixed hyperlipidemia: Secondary | ICD-10-CM

## 2020-09-09 LAB — POCT URINALYSIS DIP (CLINITEK)
Bilirubin, UA: NEGATIVE
Blood, UA: NEGATIVE
Glucose, UA: NEGATIVE mg/dL
Ketones, POC UA: NEGATIVE mg/dL
Leukocytes, UA: NEGATIVE
Nitrite, UA: NEGATIVE
Spec Grav, UA: >=1.03 — AB (ref 1.010–1.025)
Urobilinogen, UA: 0.2 E.U./dL
pH, UA: 6 (ref 5.0–8.0)

## 2020-09-09 NOTE — Progress Notes (Signed)
Established Patient Office Visit  Subjective:  Patient ID: Joanna [name], female    DOB: 05/27/1955  Age: 65 y.o. MRN: [id number]  CC:  Chief Complaint  Patient presents with   Hypertension    HPI Joanna [name] presents for follow up hypertension and hyperlipidemia  Mixed hyperlipidemia  Pt presents with hyperlipidemia. . Compliance with treatment has beengoodThe patient is compliant with medications, maintains a low cholesterol diet , follows up as directed , . The patient denies experiencing any hypercholesterolemia related symptoms. . Pt currently on crestor '20mg'$  but no longer on repatha  Pt presents for follow up of hypertension.  The patient is tolerating the medication well without side effects. Compliance with treatment has been good; including taking medication as directed , maintains a healthy diet and regular exercise regimen , and following up as directed. . Pt currently taking metoprolol 1/2 bid and lisinopril /hctz 20/12.5 qd- bp 130/78 tdoay  Pt with history of anxiety - she is doing well on trazodone, lexapro and alprazolam - says controls her symptoms well -   Pt does request shingrix vaccine today  Pt had polyuria last week - is better now and no other urine symptoms but would like to check ua Past Medical History:  Diagnosis Date   Angina    "related to stress"   Arthritis    "fingers"   Cancer ([facility]) 07/2010   left breast   Difficult intubation    "short neck"   GERD (gastroesophageal reflux disease)    H/O hiatal hernia    Headache(784.0)    History of migraines 01/26/11   "it's been several years since my last one"   Hypercholesteremia    Hypertension    echo done 2012  [facility] in [place]   PONV (postoperative nausea and vomiting)    Recurrent upper respiratory infection (URI)    sinus infection-finishing up antibiotic 01/24/11   Sleep apnea ~ 2009   sleep study done ; [place]    Past Surgical History:  Procedure Laterality Date    [place]  01/25/11   right breast "reduced & lifted"   BREAST REDUCTION SURGERY  01/25/2011   Procedure: BREAST REDUCTION WITH LIPOSUCTION;  Surgeon: Macon Large;  Location: [facility] [place];  Service: Plastics;  Laterality: Right;  Right Breast Reduction    BREAST SURGERY  08/2010   left mastectomy-patient still has drainage tube in   BREAST SURGERY  01/25/11   right reduction & lift   BUNIONECTOMY  ~ 2009   left   CARPAL TUNNEL RELEASE  1981-1984   bilaterally   CHOLECYSTECTOMY     HAMMER TOE SURGERY  ~ 2009   left   LATISSIMUS FLAP TO BREAST  12/13/2010   Procedure: LATISSIMUS FLAP TO BREAST;  Surgeon: Macon Large;  Location: [facility];  Service: Plastics;  Laterality: Left;  LEFT LATISSIMUS FLAP WITH IMPLANT    History reviewed. No pertinent family history.  Social History   Socioeconomic History   Marital status: Married    Spouse name: Not on file   Number of children: Not on file   Years of education: Not on file   Highest education level: Not on file  Occupational History   Not on file  Tobacco Use   Smoking status: Never   Smokeless tobacco: Never  Substance and Sexual Activity   Alcohol use: Yes    Comment: occ.; haven't drank  anything since 01/2010 (01/26/11)   Drug use: No   Sexual activity: Yes  Other Topics Concern   Not on file  Social History Narrative   Not on file   Social Determinants of Health   Financial Resource Strain: Not on file  Food Insecurity: Not on file  Transportation Needs: Not on file  Physical Activity: Not on file  Stress: Not on file  Social Connections: Not on file  Intimate Partner Violence: Not on file     Current Outpatient Medications:    ALPRAZolam (XANAX) 0.5 MG tablet, Take 1 tablet (0.5 mg total) by mouth at bedtime as needed for anxiety., Disp: 30 tablet, Rfl: 0   aspirin 81 MG EC tablet, Take by mouth., Disp: , Rfl:    Calcium Carbonate-Vit D-Min  (CALCIUM 1200 PO), Take 1 tablet by mouth daily., Disp: , Rfl:    Cholecalciferol 25 MCG (1000 UT) capsule, Take by mouth., Disp: , Rfl:    escitalopram (LEXAPRO) 20 MG tablet, Take 1 tablet (20 mg total) by mouth daily., Disp: 90 tablet, Rfl: 0   lisinopril-hydrochlorothiazide (ZESTORETIC) 20-12.5 MG tablet, Take 1 tablet by mouth daily., Disp: 90 tablet, Rfl: 0   meloxicam (MOBIC) 15 MG tablet, Take 1 tablet (15 mg total) by mouth daily., Disp: 90 tablet, Rfl: 1   metoprolol succinate (TOPROL-XL) 25 MG 24 hr tablet, TAKE 1/2 TABLET BY MOUTH TWICE DAILY, Disp: , Rfl:    montelukast (SINGULAIR) 10 MG tablet, Take 1 tablet (10 mg total) by mouth at bedtime., Disp: 90 tablet, Rfl: 3   rosuvastatin (CRESTOR) 20 MG tablet, Take 1 tablet (20 mg total) by mouth daily., Disp: 90 tablet, Rfl: 1   traZODone (DESYREL) 150 MG tablet, TAKE 1 TABLET BY MOUTH AT BEDTIME, Disp: 90 tablet, Rfl: 1   Allergies  Allergen Reactions   Iodinated Diagnostic Agents Rash    EATS SHELLFISH WITH NO PROBLEMS  EATS SHELLFISH WITH NO PROBLEMS EATS SHELLFISH WITH NO PROBLEMS   Latex Rash   Meperidine Hcl Nausea And Vomiting   Penicillins Rash   Shellfish Allergy Rash   Demerol [Meperidine Hcl] Nausea And Vomiting   Other Rash    ROS CONSTITUTIONAL: Negative for chills, fatigue, fever, unintentional weight gain and unintentional weight loss.  E/N/T: Negative for ear pain, nasal congestion and sore throat.  CARDIOVASCULAR: Negative for chest pain, dizziness, palpitations and pedal edema.  RESPIRATORY: Negative for recent cough and dyspnea.  GASTROINTESTINAL: Negative for abdominal pain, acid reflux symptoms, constipation, diarrhea, nausea and vomiting.  MSK: Negative for arthralgias and myalgias.  INTEGUMENTARY: Negative for rash.  NEUROLOGICAL: Negative for dizziness and headaches.  PSYCHIATRIC: Negative for sleep disturbance and to question depression screen.  Negative for depression, negative for anhedonia.         Objective:   PHYSICAL EXAM:   VS: BP 130/78 (BP Location: Left Arm, Patient Position: Sitting, Cuff Size: Large)   Pulse 61   Temp (!) 97.2 F (36.2 C) (Temporal)   Ht '5\' 2"'$  (1.575 m)   Wt 294 lb (133.4 kg)   SpO2 94%   BMI 53.77 kg/m   GEN: Well nourished, well developed, in no acute distress  Cardiac: RRR; no murmurs, rubs, or gallops,n Respiratory:  normal respiratory rate and pattern with no distress - normal breath sounds with no rales, rhonchi, wheezes or rubs MS: no deformity or atrophy  Skin: warm and dry, no rash  Psych: euthymic mood, appropriate affect and demeanor   Health Maintenance Due  Topic Date  Due   Pneumococcal Vaccine 50-32 Years old (1 - PCV) Never done   Zoster Vaccines- Shingrix (1 of 2) Never done   MAMMOGRAM  10/14/2019   INFLUENZA VACCINE  08/30/2020    There are no preventive care reminders to display for this patient.  Lab Results  Component Value Date   TSH 1.190 03/12/2020   Lab Results  Component Value Date   WBC 8.1 03/12/2020   HGB 13.9 03/12/2020   HCT 41.6 03/12/2020   MCV 86 03/12/2020   PLT 271 03/12/2020   Lab Results  Component Value Date   NA 140 03/12/2020   K 4.6 03/12/2020   CO2 22 03/12/2020   GLUCOSE 89 03/12/2020   BUN 12 03/12/2020   CREATININE 0.69 03/12/2020   BILITOT 0.7 03/12/2020   ALKPHOS 117 03/12/2020   AST 31 03/12/2020   ALT 26 03/12/2020   PROT 7.0 03/12/2020   ALBUMIN 4.4 03/12/2020   CALCIUM 10.0 03/12/2020   Lab Results  Component Value Date   CHOL 201 (H) 03/12/2020   Lab Results  Component Value Date   HDL 53 03/12/2020   Lab Results  Component Value Date   LDLCALC 129 (H) 03/12/2020   Lab Results  Component Value Date   TRIG 107 03/12/2020   Lab Results  Component Value Date   CHOLHDL 3.8 03/12/2020   No results found for: HGBA1C    Assessment & Plan:   Problem List Items Addressed This Visit       Cardiovascular and Mediastinum   Benign hypertension    Relevant Orders   CBC with Differential/Platelet   Comprehensive metabolic panel   TSH Continue current meds     Other   Anxiety   Relevant Orders   TSH Continue current meds   Mixed hyperlipidemia   Relevant Orders   CBC with Differential/Platelet   Comprehensive metabolic panel   Lipid panel Continue meds and watch diet   Other Visit Diagnoses     Need for zoster vaccination    -  Primary   Relevant Orders   Varicella-zoster vaccine IM   Polyuria       Relevant Orders   POCT URINALYSIS DIP (CLINITEK)   Vitamin D insufficiency       Relevant Orders   VITAMIN D 25 Hydroxy (Vit-D Deficiency, Fractures)       No orders of the defined types were placed in this encounter.   Follow-up: Return in about 6 months (around 03/12/2021) for chronic follow up and in 8 weeks with nurse for shingrix vaccine.    SARA R Azahel Belcastro, PA-C

## 2020-09-10 ENCOUNTER — Other Ambulatory Visit: Payer: Self-pay | Admitting: Physician Assistant

## 2020-09-10 DIAGNOSIS — E782 Mixed hyperlipidemia: Secondary | ICD-10-CM

## 2020-09-10 DIAGNOSIS — E559 Vitamin D deficiency, unspecified: Secondary | ICD-10-CM

## 2020-09-10 LAB — CBC WITH DIFFERENTIAL/PLATELET
Basophils Absolute: 0.1 10*3/uL (ref 0.0–0.2)
Basos: 1 %
EOS (ABSOLUTE): 0.4 10*3/uL (ref 0.0–0.4)
Eos: 5 %
Hematocrit: 40.5 % (ref 34.0–46.6)
Hemoglobin: 13.4 g/dL (ref 11.1–15.9)
Immature Grans (Abs): 0 10*3/uL (ref 0.0–0.1)
Immature Granulocytes: 1 %
Lymphocytes Absolute: 2.1 10*3/uL (ref 0.7–3.1)
Lymphs: 25 %
MCH: 29 pg (ref 26.6–33.0)
MCHC: 33.1 g/dL (ref 31.5–35.7)
MCV: 88 fL (ref 79–97)
Monocytes Absolute: 0.6 10*3/uL (ref 0.1–0.9)
Monocytes: 7 %
Neutrophils Absolute: 5.1 10*3/uL (ref 1.4–7.0)
Neutrophils: 61 %
Platelets: 244 10*3/uL (ref 150–450)
RBC: 4.62 x10E6/uL (ref 3.77–5.28)
RDW: 12.9 % (ref 11.7–15.4)
WBC: 8.3 10*3/uL (ref 3.4–10.8)

## 2020-09-10 LAB — COMPREHENSIVE METABOLIC PANEL
ALT: 14 IU/L (ref 0–32)
AST: 18 IU/L (ref 0–40)
Albumin/Globulin Ratio: 1.6 (ref 1.2–2.2)
Albumin: 4.2 g/dL (ref 3.8–4.8)
Alkaline Phosphatase: 87 IU/L (ref 44–121)
BUN/Creatinine Ratio: 27 (ref 12–28)
BUN: 16 mg/dL (ref 8–27)
Bilirubin Total: 0.5 mg/dL (ref 0.0–1.2)
CO2: 26 mmol/L (ref 20–29)
Calcium: 9.3 mg/dL (ref 8.7–10.3)
Chloride: 103 mmol/L (ref 96–106)
Creatinine, Ser: 0.6 mg/dL (ref 0.57–1.00)
Globulin, Total: 2.6 g/dL (ref 1.5–4.5)
Glucose: 80 mg/dL (ref 65–99)
Potassium: 4.7 mmol/L (ref 3.5–5.2)
Sodium: 140 mmol/L (ref 134–144)
Total Protein: 6.8 g/dL (ref 6.0–8.5)
eGFR: 101 mL/min/{1.73_m2} (ref >59)

## 2020-09-10 LAB — LIPID PANEL
Chol/HDL Ratio: 3.8 ratio (ref 0.0–4.4)
Cholesterol, Total: 203 mg/dL — ABNORMAL HIGH (ref 100–199)
HDL: 53 mg/dL (ref >39)
LDL Chol Calc (NIH): 132 mg/dL — ABNORMAL HIGH (ref 0–99)
Triglycerides: 101 mg/dL (ref 0–149)
VLDL Cholesterol Cal: 18 mg/dL (ref 5–40)

## 2020-09-10 LAB — VITAMIN D 25 HYDROXY (VIT D DEFICIENCY, FRACTURES): Vit D, 25-Hydroxy: 19.4 ng/mL — ABNORMAL LOW (ref 30.0–100.0)

## 2020-09-10 LAB — TSH: TSH: 0.929 u[IU]/mL (ref 0.450–4.500)

## 2020-09-10 LAB — CARDIOVASCULAR RISK ASSESSMENT

## 2020-09-10 MED ORDER — VITAMIN D (ERGOCALCIFEROL) 1.25 MG (50000 UNIT) PO CAPS: 50000.0000 [IU] | ORAL_CAPSULE | ORAL | 1 refills | Status: DC

## 2020-09-10 MED ORDER — ROSUVASTATIN CALCIUM 40 MG PO TABS: 40.0000 mg | ORAL_TABLET | Freq: Every day | ORAL | 1 refills | Status: DC

## 2020-10-15 ENCOUNTER — Other Ambulatory Visit: Payer: Self-pay

## 2020-10-15 DIAGNOSIS — F419 Anxiety disorder, unspecified: Secondary | ICD-10-CM

## 2020-10-15 MED ORDER — ESCITALOPRAM OXALATE 20 MG PO TABS: 20.0000 mg | ORAL_TABLET | Freq: Every day | ORAL | 0 refills | Status: DC

## 2020-10-15 MED ORDER — ALPRAZOLAM 0.5 MG PO TABS: 0.5000 mg | ORAL_TABLET | Freq: Every evening | ORAL | 0 refills | Status: DC | PRN

## 2020-10-18 ENCOUNTER — Other Ambulatory Visit: Payer: Self-pay

## 2020-10-18 MED ORDER — LISINOPRIL-HYDROCHLOROTHIAZIDE 20-12.5 MG PO TABS: 1.0000 | ORAL_TABLET | Freq: Every day | ORAL | 0 refills | Status: DC

## 2020-10-19 ENCOUNTER — Other Ambulatory Visit: Payer: Self-pay | Admitting: Physician Assistant

## 2020-10-19 MED ORDER — MELOXICAM 15 MG PO TABS: 15.0000 mg | ORAL_TABLET | Freq: Every day | ORAL | 1 refills | Status: DC

## 2020-11-04 ENCOUNTER — Ambulatory Visit (INDEPENDENT_AMBULATORY_CARE_PROVIDER_SITE_OTHER): Payer: Managed Care, Other (non HMO)

## 2020-11-04 ENCOUNTER — Other Ambulatory Visit: Payer: Self-pay

## 2020-11-04 DIAGNOSIS — Z23 Encounter for immunization: Secondary | ICD-10-CM | POA: Diagnosis not present

## 2020-11-16 ENCOUNTER — Other Ambulatory Visit: Payer: Self-pay | Admitting: Physician Assistant

## 2020-11-16 DIAGNOSIS — F419 Anxiety disorder, unspecified: Secondary | ICD-10-CM

## 2020-11-16 MED ORDER — ALPRAZOLAM 0.5 MG PO TABS: 0.5000 mg | ORAL_TABLET | Freq: Every evening | ORAL | 0 refills | Status: DC | PRN

## 2020-12-17 ENCOUNTER — Other Ambulatory Visit: Payer: Self-pay | Admitting: Physician Assistant

## 2020-12-17 DIAGNOSIS — F419 Anxiety disorder, unspecified: Secondary | ICD-10-CM

## 2020-12-17 MED ORDER — ALPRAZOLAM 0.5 MG PO TABS: 0.5000 mg | ORAL_TABLET | Freq: Every evening | ORAL | 0 refills | Status: DC | PRN

## 2021-01-12 ENCOUNTER — Other Ambulatory Visit: Payer: Self-pay

## 2021-01-12 DIAGNOSIS — F419 Anxiety disorder, unspecified: Secondary | ICD-10-CM

## 2021-01-12 MED ORDER — LISINOPRIL-HYDROCHLOROTHIAZIDE 20-12.5 MG PO TABS: 1.0000 | ORAL_TABLET | Freq: Every day | ORAL | 0 refills | Status: DC

## 2021-01-12 MED ORDER — ALPRAZOLAM 0.5 MG PO TABS: 0.5000 mg | ORAL_TABLET | Freq: Every evening | ORAL | 0 refills | Status: DC | PRN

## 2021-02-21 ENCOUNTER — Other Ambulatory Visit: Payer: Self-pay | Admitting: Family Medicine

## 2021-02-22 ENCOUNTER — Other Ambulatory Visit: Payer: Self-pay

## 2021-02-22 DIAGNOSIS — F419 Anxiety disorder, unspecified: Secondary | ICD-10-CM

## 2021-02-22 MED ORDER — ESCITALOPRAM OXALATE 20 MG PO TABS: 20.0000 mg | ORAL_TABLET | Freq: Every day | ORAL | 0 refills | Status: DC

## 2021-02-22 MED ORDER — ALPRAZOLAM 0.5 MG PO TABS: 0.5000 mg | ORAL_TABLET | Freq: Every evening | ORAL | 0 refills | Status: DC | PRN

## 2021-03-14 ENCOUNTER — Ambulatory Visit: Payer: Managed Care, Other (non HMO) | Admitting: Physician Assistant

## 2021-03-14 ENCOUNTER — Other Ambulatory Visit: Payer: Self-pay

## 2021-03-14 ENCOUNTER — Encounter: Payer: Self-pay | Admitting: Physician Assistant

## 2021-03-14 VITALS — BP 140/88 | HR 54 | Temp 98.5°F | Ht 62.0 in | Wt 290.0 lb

## 2021-03-14 DIAGNOSIS — Z23 Encounter for immunization: Secondary | ICD-10-CM | POA: Diagnosis not present

## 2021-03-14 DIAGNOSIS — F419 Anxiety disorder, unspecified: Secondary | ICD-10-CM

## 2021-03-14 DIAGNOSIS — I10 Essential (primary) hypertension: Secondary | ICD-10-CM | POA: Diagnosis not present

## 2021-03-14 DIAGNOSIS — E559 Vitamin D deficiency, unspecified: Secondary | ICD-10-CM

## 2021-03-14 DIAGNOSIS — E782 Mixed hyperlipidemia: Secondary | ICD-10-CM | POA: Diagnosis not present

## 2021-03-14 DIAGNOSIS — Z1231 Encounter for screening mammogram for malignant neoplasm of breast: Secondary | ICD-10-CM

## 2021-03-14 MED ORDER — VITAMIN D (ERGOCALCIFEROL) 1.25 MG (50000 UNIT) PO CAPS: 50000.0000 [IU] | ORAL_CAPSULE | ORAL | 1 refills | Status: DC

## 2021-03-14 NOTE — Progress Notes (Signed)
Subjective:  Patient ID: Joanna [name], female    DOB: Aug 04, 1955  Age: 66 y.o. MRN: [id number]  Chief Complaint  Patient presents with   Hypertension   Hyperlipidemia    HPI  Pt presents for follow up of hypertension. The patient is tolerating the medication well without side effects. Compliance with treatment has been good; including taking medication as directed , maintains a healthy diet and regular exercise regimen , and following up as directed. Pt bp slightly elevated today but she states she has not taken her medication She is currently on zestoretic 20/12.5 qd and toprol XL 25mg    Mixed hyperlipidemia  Pt presents with hyperlipidemia. The patient is compliant with medications, maintains a low cholesterol diet , follows up as directed ,  The patient denies experiencing any hypercholesterolemia related symptoms. She is on crestor 40mg  qd  Pt with history of anxiety - stable on lexapro, trazodone and xanax - voices no problems  Pt with history of fit D def - is on supplements - due for labwork  Pt is due for mammogram Pt would like flu shot today  Current Outpatient Medications on File Prior to Visit  Medication Sig Dispense Refill   ALPRAZolam (XANAX) 0.5 MG tablet Take 1 tablet (0.5 mg total) by mouth at bedtime as needed for anxiety. 30 tablet 0   aspirin 81 MG EC tablet Take by mouth.     Calcium Carbonate-Vit D-Min (CALCIUM 1200 PO) Take 1 tablet by mouth daily.     Cholecalciferol 25 MCG (1000 UT) capsule Take by mouth.     escitalopram (LEXAPRO) 20 MG tablet Take 1 tablet (20 mg total) by mouth daily. 90 tablet 0   lisinopril-hydrochlorothiazide (ZESTORETIC) 20-12.5 MG tablet Take 1 tablet by mouth daily. 90 tablet 0   meloxicam (MOBIC) 15 MG tablet Take 1 tablet (15 mg total) by mouth daily. 90 tablet 1   metoprolol succinate (TOPROL-XL) 25 MG 24 hr tablet TAKE 1/2 TABLET BY MOUTH TWICE DAILY     montelukast (SINGULAIR) 10 MG tablet Take 1 tablet (10 mg total) by  mouth at bedtime. 90 tablet 3   rosuvastatin (CRESTOR) 40 MG tablet Take 1 tablet (40 mg total) by mouth daily. 90 tablet 1   traZODone (DESYREL) 150 MG tablet TAKE 1 TABLET BY MOUTH AT BEDTIME 90 tablet 1   No current facility-administered medications on file prior to visit.   Past Medical History:  Diagnosis Date   Angina    "related to stress"   Arthritis    "fingers"   Cancer ([facility]) 07/2010   left breast   Difficult intubation    "short neck"   GERD (gastroesophageal reflux disease)    H/O hiatal hernia    Headache(784.0)    History of migraines 01/26/11   "it's been several years since my last one"   Hypercholesteremia    Hypertension    echo done 2012  [facility] in [place]   PONV (postoperative nausea and vomiting)    Recurrent upper respiratory infection (URI)    sinus infection-finishing up antibiotic 01/24/11   Sleep apnea ~ 2009   sleep study done ; [place]   Past Surgical History:  Procedure Laterality Date   [place]  01/25/11   right breast "reduced & lifted"   BREAST REDUCTION SURGERY  01/25/2011   Procedure: BREAST REDUCTION WITH LIPOSUCTION;  Surgeon: Macon Large;  [facility];  Service: Clinical cytogeneticist;  Laterality: Right;  Right Breast Reduction    BREAST SURGERY  08/2010   left mastectomy-patient still has drainage tube in   BREAST SURGERY  01/25/11   right reduction & lift   BUNIONECTOMY  ~ 2009   left   CARPAL TUNNEL RELEASE  1981-1984   bilaterally   CHOLECYSTECTOMY     HAMMER TOE SURGERY  ~ 2009   left   LATISSIMUS FLAP TO BREAST  12/13/2010   Procedure: LATISSIMUS FLAP TO BREAST;  Surgeon: Macon Large;  Location: [facility];  Service: Plastics;  Laterality: Left;  LEFT LATISSIMUS FLAP WITH IMPLANT    History reviewed. No pertinent family history. Social History   Socioeconomic History   Marital status: Married    Spouse name: Not on file   Number of children: Not on  file   Years of education: Not on file   Highest education level: Not on file  Occupational History   Not on file  Tobacco Use   Smoking status: Never   Smokeless tobacco: Never  Substance and Sexual Activity   Alcohol use: Yes    Comment: occ.; haven't drank anything since 01/2010 (01/26/11)   Drug use: No   Sexual activity: Yes  Other Topics Concern   Not on file  Social History Narrative   Not on file   Social Determinants of Health   Financial Resource Strain: Not on file  Food Insecurity: Not on file  Transportation Needs: Not on file  Physical Activity: Not on file  Stress: Not on file  Social Connections: Not on file    Review of Systems CONSTITUTIONAL: Negative for chills, fatigue, fever, unintentional weight gain and unintentional weight loss.  E/N/T: Negative for ear pain, nasal congestion and sore throat.  CARDIOVASCULAR: Negative for chest pain, dizziness, palpitations and pedal edema.  RESPIRATORY: Negative for recent cough and dyspnea.  GASTROINTESTINAL: Negative for abdominal pain, acid reflux symptoms, constipation, diarrhea, nausea and vomiting.  MSK: Negative for arthralgias and myalgias.  INTEGUMENTARY: Negative for rash.  NEUROLOGICAL: Negative for dizziness and headaches.  PSYCHIATRIC: Negative for sleep disturbance and to question depression screen.  Negative for depression, negative for anhedonia.       Objective:  PHYSICAL EXAM:   VS: BP 140/88 (BP Location: Left Arm, Patient Position: Sitting)    Pulse (!) 54    Temp 98.5 F (36.9 C) (Oral)    Ht 5\' 2"  (1.575 m)    Wt 290 lb (131.5 kg)    SpO2 96%    BMI 53.04 kg/m   GEN: Well nourished, well developed, in no acute distress   Cardiac: RRR; no murmurs, rubs, or gallops,no edema -  Respiratory:  normal respiratory rate and pattern with no distress - normal breath sounds with no rales, rhonchi, wheezes or rubs Skin: warm and dry, no rash  Neuro:  Alert and Oriented x 3, Strength and sensation  are intact - CN II-Xii grossly intact Psych: euthymic mood, appropriate affect and demeanor   Lab Results  Component Value Date   WBC 8.3 09/09/2020   HGB 13.4 09/09/2020   HCT 40.5 09/09/2020   PLT 244 09/09/2020   GLUCOSE 80 09/09/2020   CHOL 203 (H) 09/09/2020   TRIG 101 09/09/2020   HDL 53 09/09/2020   LDLCALC 132 (H) 09/09/2020   ALT 14 09/09/2020   AST 18 09/09/2020   NA 140 09/09/2020   K 4.7 09/09/2020   CL 103 09/09/2020   CREATININE 0.60 09/09/2020  BUN 16 09/09/2020   CO2 26 09/09/2020   TSH 0.929 09/09/2020      Assessment & Plan:   Problem List Items Addressed This Visit       Cardiovascular and Mediastinum   Benign hypertension - Primary   Relevant Orders   CBC with Differential/Platelet   Comprehensive metabolic panel   TSH Continue meds     Other   Anxiety   Relevant Orders   TSH Continue meds   Mixed hyperlipidemia   Relevant Orders   Lipid panel Continue meds and watch diet   Other Visit Diagnoses     Vitamin D insufficiency       Relevant Medications   Vitamin D, Ergocalciferol, (DRISDOL) 1.25 MG (50000 UNIT) CAPS capsule   Other Relevant Orders   VITAMIN D 25 Hydroxy (Vit-D Deficiency, Fractures)   Needs flu shot       Relevant Orders   Flu Vaccine MDCK QUAD PF   Encounter for screening mammogram for breast cancer       Relevant Orders   MM DIGITAL SCREENING BILATERAL     .  Meds ordered this encounter  Medications   Vitamin D, Ergocalciferol, (DRISDOL) 1.25 MG (50000 UNIT) CAPS capsule    Sig: Take 1 capsule (50,000 Units total) by mouth every 7 (seven) days.    Dispense:  12 capsule    Refill:  1    Order Specific Question:   Supervising Provider    Answer:   Shelton Silvas    Orders Placed This Encounter  Procedures   MM DIGITAL SCREENING BILATERAL   Flu Vaccine MDCK QUAD PF   CBC with Differential/Platelet   Comprehensive metabolic panel   TSH   Lipid panel   VITAMIN D 25 Hydroxy (Vit-D Deficiency,  Fractures)     Follow-up: Return in about 6 months (around 09/11/2021) for chronic fasting follow up and 1 month for nurse visit bp check.  An After Visit Summary was printed and given to the patient.  Yetta Flock [facility] [phone]

## 2021-03-15 ENCOUNTER — Other Ambulatory Visit: Payer: Self-pay | Admitting: Physician Assistant

## 2021-03-15 DIAGNOSIS — E559 Vitamin D deficiency, unspecified: Secondary | ICD-10-CM

## 2021-03-15 LAB — CBC WITH DIFFERENTIAL/PLATELET
Basophils Absolute: 0 10*3/uL (ref 0.0–0.2)
Basos: 1 %
EOS (ABSOLUTE): 0.4 10*3/uL (ref 0.0–0.4)
Eos: 6 %
Hematocrit: 39.7 % (ref 34.0–46.6)
Hemoglobin: 13.4 g/dL (ref 11.1–15.9)
Immature Grans (Abs): 0 10*3/uL (ref 0.0–0.1)
Immature Granulocytes: 1 %
Lymphocytes Absolute: 2.1 10*3/uL (ref 0.7–3.1)
Lymphs: 29 %
MCH: 29.3 pg (ref 26.6–33.0)
MCHC: 33.8 g/dL (ref 31.5–35.7)
MCV: 87 fL (ref 79–97)
Monocytes Absolute: 0.5 10*3/uL (ref 0.1–0.9)
Monocytes: 7 %
Neutrophils Absolute: 4.2 10*3/uL (ref 1.4–7.0)
Neutrophils: 56 %
Platelets: 270 10*3/uL (ref 150–450)
RBC: 4.57 x10E6/uL (ref 3.77–5.28)
RDW: 12.8 % (ref 11.7–15.4)
WBC: 7.4 10*3/uL (ref 3.4–10.8)

## 2021-03-15 LAB — COMPREHENSIVE METABOLIC PANEL
ALT: 17 IU/L (ref 0–32)
AST: 21 IU/L (ref 0–40)
Albumin/Globulin Ratio: 1.7 (ref 1.2–2.2)
Albumin: 4.3 g/dL (ref 3.8–4.8)
Alkaline Phosphatase: 92 IU/L (ref 44–121)
BUN/Creatinine Ratio: 21 (ref 12–28)
BUN: 15 mg/dL (ref 8–27)
Bilirubin Total: 0.4 mg/dL (ref 0.0–1.2)
CO2: 23 mmol/L (ref 20–29)
Calcium: 9.8 mg/dL (ref 8.7–10.3)
Chloride: 103 mmol/L (ref 96–106)
Creatinine, Ser: 0.71 mg/dL (ref 0.57–1.00)
Globulin, Total: 2.5 g/dL (ref 1.5–4.5)
Glucose: 90 mg/dL (ref 70–99)
Potassium: 4.6 mmol/L (ref 3.5–5.2)
Sodium: 140 mmol/L (ref 134–144)
Total Protein: 6.8 g/dL (ref 6.0–8.5)
eGFR: 95 mL/min/{1.73_m2} (ref >59)

## 2021-03-15 LAB — LIPID PANEL
Chol/HDL Ratio: 4 ratio (ref 0.0–4.4)
Cholesterol, Total: 232 mg/dL — ABNORMAL HIGH (ref 100–199)
HDL: 58 mg/dL (ref >39)
LDL Chol Calc (NIH): 157 mg/dL — ABNORMAL HIGH (ref 0–99)
Triglycerides: 94 mg/dL (ref 0–149)
VLDL Cholesterol Cal: 17 mg/dL (ref 5–40)

## 2021-03-15 LAB — CARDIOVASCULAR RISK ASSESSMENT

## 2021-03-15 LAB — VITAMIN D 25 HYDROXY (VIT D DEFICIENCY, FRACTURES): Vit D, 25-Hydroxy: 27.9 ng/mL — ABNORMAL LOW (ref 30.0–100.0)

## 2021-03-15 LAB — TSH: TSH: 0.8 u[IU]/mL (ref 0.450–4.500)

## 2021-03-15 MED ORDER — VITAMIN D (ERGOCALCIFEROL) 1.25 MG (50000 UNIT) PO CAPS: 50000.0000 [IU] | ORAL_CAPSULE | ORAL | 1 refills | Status: DC

## 2021-03-17 ENCOUNTER — Ambulatory Visit (INDEPENDENT_AMBULATORY_CARE_PROVIDER_SITE_OTHER): Payer: Managed Care, Other (non HMO)

## 2021-03-17 ENCOUNTER — Other Ambulatory Visit: Payer: Self-pay

## 2021-03-17 VITALS — BP 142/90 | HR 53

## 2021-03-17 DIAGNOSIS — I10 Essential (primary) hypertension: Secondary | ICD-10-CM

## 2021-03-17 NOTE — Addendum Note (Signed)
Addended by: Thompson Caul I on: 03/17/2021 11:49 AM   Modules accepted: Level of Service

## 2021-03-17 NOTE — Progress Notes (Signed)
Patient is here to check her blood pressure. Her blood pressure was 142/90 pulse 53. Per Marge Duncans, she needs to take her medication everyday and check her blood pressure every day and record it. She will return in 2 weeks to recheck blood pressure.

## 2021-03-31 ENCOUNTER — Ambulatory Visit: Payer: Managed Care, Other (non HMO)

## 2021-04-01 ENCOUNTER — Other Ambulatory Visit: Payer: Self-pay

## 2021-04-01 DIAGNOSIS — F419 Anxiety disorder, unspecified: Secondary | ICD-10-CM

## 2021-04-01 MED ORDER — ALPRAZOLAM 0.5 MG PO TABS: 0.5000 mg | ORAL_TABLET | Freq: Every evening | ORAL | 0 refills | Status: DC | PRN

## 2021-04-14 ENCOUNTER — Other Ambulatory Visit: Payer: Self-pay

## 2021-04-14 MED ORDER — MELOXICAM 15 MG PO TABS: 15.0000 mg | ORAL_TABLET | Freq: Every day | ORAL | 1 refills | Status: DC

## 2021-05-09 ENCOUNTER — Other Ambulatory Visit: Payer: Self-pay | Admitting: Physician Assistant

## 2021-05-09 DIAGNOSIS — F419 Anxiety disorder, unspecified: Secondary | ICD-10-CM

## 2021-05-09 MED ORDER — ALPRAZOLAM 0.5 MG PO TABS: 0.5000 mg | ORAL_TABLET | Freq: Every evening | ORAL | 0 refills | Status: DC | PRN

## 2021-06-13 ENCOUNTER — Other Ambulatory Visit: Payer: Self-pay | Admitting: Physician Assistant

## 2021-06-13 DIAGNOSIS — F419 Anxiety disorder, unspecified: Secondary | ICD-10-CM

## 2021-06-13 MED ORDER — ALPRAZOLAM 0.5 MG PO TABS: 0.5000 mg | ORAL_TABLET | Freq: Every evening | ORAL | 0 refills | Status: DC | PRN

## 2021-06-13 MED ORDER — ESCITALOPRAM OXALATE 20 MG PO TABS: 20.0000 mg | ORAL_TABLET | Freq: Every day | ORAL | 0 refills | Status: DC

## 2021-06-17 ENCOUNTER — Other Ambulatory Visit: Payer: Self-pay

## 2021-06-17 DIAGNOSIS — F419 Anxiety disorder, unspecified: Secondary | ICD-10-CM

## 2021-06-17 MED ORDER — ALPRAZOLAM 0.5 MG PO TABS: 0.5000 mg | ORAL_TABLET | Freq: Every evening | ORAL | 0 refills | Status: DC | PRN

## 2021-06-28 ENCOUNTER — Other Ambulatory Visit: Payer: Self-pay | Admitting: Physician Assistant

## 2021-06-28 DIAGNOSIS — F419 Anxiety disorder, unspecified: Secondary | ICD-10-CM

## 2021-06-28 MED ORDER — ALPRAZOLAM 0.5 MG PO TABS: 0.5000 mg | ORAL_TABLET | Freq: Every evening | ORAL | 1 refills | Status: DC | PRN

## 2021-08-18 ENCOUNTER — Encounter: Payer: Self-pay | Admitting: Oncology

## 2021-08-18 ENCOUNTER — Other Ambulatory Visit: Payer: Self-pay | Admitting: Oncology

## 2021-08-18 ENCOUNTER — Inpatient Hospital Stay: Payer: Managed Care, Other (non HMO) | Attending: Oncology | Admitting: Oncology

## 2021-08-18 VITALS — BP 142/63 | HR 58 | Temp 98.1°F | Resp 18 | Ht 62.0 in | Wt 292.9 lb

## 2021-08-18 DIAGNOSIS — C50912 Malignant neoplasm of unspecified site of left female breast: Secondary | ICD-10-CM | POA: Diagnosis not present

## 2021-08-18 DIAGNOSIS — Z170 Estrogen receptor positive status [ER+]: Secondary | ICD-10-CM

## 2021-08-18 NOTE — Progress Notes (Signed)
Subjective:  Patient ID: Joanna [name], female    DOB: 1955-12-11  Age: 66 y.o. MRN: [id number]  Chief Complaint  Patient presents with   Follow-up  HPI: The patient is a 66 year old woman with a stage 0 (Tis N0 M0) ductal carcinoma of the left breast diagnosed in July 2012. She was treated with a left mastectomy. She had a tram flap reconstruction. Pathology revealed a 2.5 cm, high grade, ductal carcinoma in situ with microcalcifications. There were 2 foci which were suspicious for microinvasive disease and a negative sentinel node. She was placed on raloxifene for chemoprevention in August 2012 and completed 5 years of therapy in August 2017. Due to her personal and family history, she underwent testing for hereditary breast and ovarian cancer with the [facility] myRisk hereditary cancer panel. This did not reveal a clinically significant mutation. There was initially a variant of uncertain significance of the ATM gene, however, this variant has since been reclassified as a benign variant, so does not increase her risk cancer.     INTERVAL HISTORY Evelise is here today for a routine follow up. She reports of feeling well. She had a mammogram last week and results showed no concern. Her appetite is good. She has gained 2 pounds since her last visit in February. Wt Readings from Last 3 Encounters:  09/14/21 288 lb (130.6 kg)  08/18/21 292 lb 14.4 oz (132.9 kg)  03/14/21 290 lb (131.5 kg)       MEDS: Current Outpatient Medications on File Prior to Visit  Medication Sig Dispense Refill   ALPRAZolam (XANAX) 0.5 MG tablet Take 1 tablet (0.5 mg total) by mouth at bedtime as needed for anxiety. 30 tablet 1   aspirin 81 MG EC tablet Take by mouth.     traZODone (DESYREL) 150 MG tablet TAKE 1 TABLET BY MOUTH AT BEDTIME 90 tablet 1   Vitamin D, Ergocalciferol, (DRISDOL) 1.25 MG (50000 UNIT) CAPS capsule Take 1 capsule (50,000 Units total) by mouth 2 (two) times a week. 24 capsule 1   No current  facility-administered medications on file prior to visit.   Past Medical History:  Diagnosis Date   Angina    "related to stress"   Arthritis    "fingers"   Cancer ([place]) 07/2010   left breast   Difficult intubation    "short neck"   GERD (gastroesophageal reflux disease)    H/O hiatal hernia    Headache(784.0)    History of migraines 01/26/11   "it's been several years since my last one"   Hypercholesteremia    Hypertension    echo done 2012  [facility] in [place]   PONV (postoperative nausea and vomiting)    Recurrent upper respiratory infection (URI)    sinus infection-finishing up antibiotic 01/24/11   Sleep apnea ~ 2009   sleep study done ; [place]   Past Surgical History:  Procedure Laterality Date   [place]  01/25/11   right breast "reduced & lifted"   BREAST REDUCTION SURGERY  01/25/2011   Procedure: BREAST REDUCTION WITH LIPOSUCTION;  Surgeon: Macon Large;  Location: [facility];  Service: Plastics;  Laterality: Right;  Right Breast Reduction    BREAST SURGERY  08/2010   left mastectomy-patient still has drainage tube in   BREAST SURGERY  01/25/11   right reduction & lift   BUNIONECTOMY  ~ 2009   left   CARPAL  TUNNEL RELEASE  1981-1984   bilaterally   CHOLECYSTECTOMY     HAMMER TOE SURGERY  ~ 2009   left   LATISSIMUS FLAP TO BREAST  12/13/2010   Procedure: LATISSIMUS FLAP TO BREAST;  Surgeon: Macon Large;  Location: [facility];  Service: Plastics;  Laterality: Left;  LEFT LATISSIMUS FLAP WITH IMPLANT    No family history on file. Social History   Socioeconomic History   Marital status: Married    Spouse name: Not on file   Number of children: Not on file   Years of education: Not on file   Highest education level: Not on file  Occupational History   Not on file  Tobacco Use   Smoking status: Never   Smokeless tobacco: Never  Substance and Sexual Activity   Alcohol use: Yes     Comment: occ.; haven't drank anything since 01/2010 (01/26/11)   Drug use: No   Sexual activity: Yes  Other Topics Concern   Not on file  Social History Narrative   Not on file   Social Determinants of Health   Financial Resource Strain: Not on file  Food Insecurity: Not on file  Transportation Needs: Not on file  Physical Activity: Not on file  Stress: Not on file  Social Connections: Not on file    Review of Systems CONSTITUTIONAL: Negative for chills, fatigue, fever, unintentional weight gain and unintentional weight loss.  E/N/T: Negative for ear pain, nasal congestion and sore throat.  CARDIOVASCULAR: Negative for chest pain, dizziness, palpitations and pedal edema.  RESPIRATORY: Negative for recent cough and dyspnea.  GASTROINTESTINAL: Negative for abdominal pain, acid reflux symptoms, constipation, diarrhea, nausea and vomiting.  MSK: Negative for arthralgias and myalgias.  INTEGUMENTARY: Negative for rash.  NEUROLOGICAL: Negative for dizziness and headaches.  PSYCHIATRIC: Negative for sleep disturbance and to question depression screen.  Negative for depression, negative for anhedonia.       Objective:  PHYSICAL EXAM:   VS: BP (!) 142/63 (BP Location: Right Arm, Patient Position: Sitting)   Pulse (!) 58   Temp 98.1 F (36.7 C) (Oral)   Resp 18   Ht 5' 2"  (1.575 m)   Wt 292 lb 14.4 oz (132.9 kg)   SpO2 94%   BMI 53.57 kg/m   GEN: Well nourished, well developed, in no acute distress   Cardiac: RRR; no murmurs, rubs, or gallops,no edema -  Respiratory:  normal respiratory rate and pattern with no distress - normal breath sounds with no rales, rhonchi, wheezes or rubs Skin: warm and dry, no rash  Large left phlanx scar which is well healed Neuro:  Alert and Oriented x 3, Strength and sensation are intact - CN II-Xii grossly intact Psych: euthymic mood, appropriate affect and demeanor Chest:Right breast without masses and left reconstruction is negative.  Lab  Results  Component Value Date   WBC 7.0 09/14/2021   HGB 13.4 09/14/2021   HCT 40.6 09/14/2021   PLT 251 09/14/2021   GLUCOSE 91 09/14/2021   CHOL 284 (H) 09/14/2021   TRIG 145 09/14/2021   HDL 48 09/14/2021   LDLCALC 209 (H) 09/14/2021   ALT 16 09/14/2021   AST 19 09/14/2021   NA 140 09/14/2021   K 4.5 09/14/2021   CL 102 09/14/2021   CREATININE 0.69 09/14/2021   BUN 16 09/14/2021   CO2 23 09/14/2021   TSH 0.878 09/14/2021    Assessment & Plan:   ASSESSMENT: Stage 0 Breast Cancer  Now 11 years post-op with  no evidence with disease. She did take chemoprevention with 5 years of Raloxifene.   PLAN: She will continue to follow with her primary care physician at [facility]. We will see her back in 1 year with right mammogram.The patient understands the plans discussed today and is in agreement with them.  She knows to contact our office if she develops concerns prior to her next appointment.   I,Gabriella Ballesteros,acting as a scribe for Derwood Kaplan, MD.,have documented all relevant documentation on the behalf of Derwood Kaplan, MD,as directed by  Derwood Kaplan, MD while in the presence of Derwood Kaplan, MD.   Derwood Kaplan, MD [place] [phone]

## 2021-08-31 ENCOUNTER — Other Ambulatory Visit: Payer: Self-pay | Admitting: Physician Assistant

## 2021-08-31 MED ORDER — LISINOPRIL-HYDROCHLOROTHIAZIDE 20-12.5 MG PO TABS: 1.0000 | ORAL_TABLET | Freq: Every day | ORAL | 0 refills | Status: DC

## 2021-09-14 ENCOUNTER — Ambulatory Visit (INDEPENDENT_AMBULATORY_CARE_PROVIDER_SITE_OTHER): Payer: Managed Care, Other (non HMO) | Admitting: Physician Assistant

## 2021-09-14 ENCOUNTER — Encounter: Payer: Self-pay | Admitting: Physician Assistant

## 2021-09-14 VITALS — BP 138/88 | HR 57 | Temp 97.8°F | Resp 18 | Ht 62.0 in | Wt 288.0 lb

## 2021-09-14 DIAGNOSIS — R5383 Other fatigue: Secondary | ICD-10-CM

## 2021-09-14 DIAGNOSIS — F419 Anxiety disorder, unspecified: Secondary | ICD-10-CM

## 2021-09-14 DIAGNOSIS — E559 Vitamin D deficiency, unspecified: Secondary | ICD-10-CM | POA: Diagnosis not present

## 2021-09-14 DIAGNOSIS — E782 Mixed hyperlipidemia: Secondary | ICD-10-CM

## 2021-09-14 DIAGNOSIS — I10 Essential (primary) hypertension: Secondary | ICD-10-CM

## 2021-09-14 NOTE — Progress Notes (Signed)
Subjective:  Patient ID: Joanna [name], female    DOB: 10/09/1955  Age: 66 y.o. MRN: [id number]  Chief Complaint  Patient presents with   Hypertension    HPI  Pt presents for follow up of hypertension. The patient is tolerating the medication well without side effects. Compliance with treatment has been good; including taking medication as directed , maintains a healthy diet and regular exercise regimen , and following up as directed. Currently taking zestoretic 20/12.'5mg'$  qd (did not take this morning)  Pt with history of vit D def - currently on supplement weekly - due for labwork  Pt with history of anxiety - she had actually been taking her lexapro '20mg'$  bid instead of qd for the past few weeks because she was having some stress at work - recommend to go back to qd and can uses her alprazolam as needed She is also taking trazodone at night which helps her sleep as well as mood  Pt with history of osteoarthritis - mostly in knees - she also increased her mobic on her own to '15mg'$  bid - she is told to only use once daily and can use tylenol for any breakthrough pain  Current Outpatient Medications on File Prior to Visit  Medication Sig Dispense Refill   ALPRAZolam (XANAX) 0.5 MG tablet Take 1 tablet (0.5 mg total) by mouth at bedtime as needed for anxiety. 30 tablet 1   aspirin 81 MG EC tablet Take by mouth.     escitalopram (LEXAPRO) 20 MG tablet Take 1 tablet (20 mg total) by mouth daily. 90 tablet 0   lisinopril-hydrochlorothiazide (ZESTORETIC) 20-12.5 MG tablet Take 1 tablet by mouth daily. 90 tablet 0   meloxicam (MOBIC) 15 MG tablet Take 1 tablet (15 mg total) by mouth daily. 90 tablet 1   traZODone (DESYREL) 150 MG tablet TAKE 1 TABLET BY MOUTH AT BEDTIME 90 tablet 1   Vitamin D, Ergocalciferol, (DRISDOL) 1.25 MG (50000 UNIT) CAPS capsule Take 1 capsule (50,000 Units total) by mouth 2 (two) times a week. 24 capsule 1   No current facility-administered medications on file prior  to visit.   Past Medical History:  Diagnosis Date   Angina    "related to stress"   Arthritis    "fingers"   Cancer ([place]) 07/2010   left breast   Difficult intubation    "short neck"   GERD (gastroesophageal reflux disease)    H/O hiatal hernia    Headache(784.0)    History of migraines 01/26/11   "it's been several years since my last one"   Hypercholesteremia    Hypertension    echo done 2012  [place] [facility] in [place]   PONV (postoperative nausea and vomiting)    Recurrent upper respiratory infection (URI)    sinus infection-finishing up antibiotic 01/24/11   Sleep apnea ~ 2009   sleep study done ; [place]   Past Surgical History:  Procedure Laterality Date   [place]  01/25/11   right breast "reduced & lifted"   BREAST REDUCTION SURGERY  01/25/2011   Procedure: BREAST REDUCTION WITH LIPOSUCTION;  Surgeon: Macon Large;  Location: [facility];  Service: Plastics;  Laterality: Right;  Right Breast Reduction    BREAST SURGERY  08/2010   left mastectomy-patient still has drainage tube in   BREAST SURGERY  01/25/11   right reduction & lift   BUNIONECTOMY  ~ 2009   left  CARPAL TUNNEL RELEASE  1981-1984   bilaterally   CHOLECYSTECTOMY     HAMMER TOE SURGERY  ~ 2009   left   LATISSIMUS FLAP TO BREAST  12/13/2010   Procedure: LATISSIMUS FLAP TO BREAST;  Surgeon: Macon Large;  Location: [facility];  Service: Plastics;  Laterality: Left;  LEFT LATISSIMUS FLAP WITH IMPLANT    History reviewed. No pertinent family history. Social History   Socioeconomic History   Marital status: Married    Spouse name: Not on file   Number of children: Not on file   Years of education: Not on file   Highest education level: Not on file  Occupational History   Not on file  Tobacco Use   Smoking status: Never   Smokeless tobacco: Never  Substance and Sexual Activity   Alcohol use: Yes    Comment: occ.; haven't drank  anything since 01/2010 (01/26/11)   Drug use: No   Sexual activity: Yes  Other Topics Concern   Not on file  Social History Narrative   Not on file   Social Determinants of Health   Financial Resource Strain: Not on file  Food Insecurity: Not on file  Transportation Needs: Not on file  Physical Activity: Not on file  Stress: Not on file  Social Connections: Not on file    Review of Systems .   Objective:  PHYSICAL EXAM:   VS: BP 138/88   Pulse (!) 57   Temp 97.8 F (36.6 C)   Resp 18   Ht '5\' 2"'$  (1.575 m)   Wt 288 lb (130.6 kg)   SpO2 94%   BMI 52.68 kg/m   GEN: Well nourished, well developed, in no acute distress   Cardiac: RRR; no murmurs, rubs, or gallops,no edema -  Respiratory:  normal respiratory rate and pattern with no distress - normal breath sounds with no rales, rhonchi, wheezes or rubs  MS: no deformity or atrophy  Skin: warm and dry, no rash   Psych: euthymic mood, appropriate affect and demeanor   Lab Results  Component Value Date   WBC 7.4 03/14/2021   HGB 13.4 03/14/2021   HCT 39.7 03/14/2021   PLT 270 03/14/2021   GLUCOSE 90 03/14/2021   CHOL 232 (H) 03/14/2021   TRIG 94 03/14/2021   HDL 58 03/14/2021   LDLCALC 157 (H) 03/14/2021   ALT 17 03/14/2021   AST 21 03/14/2021   NA 140 03/14/2021   K 4.6 03/14/2021   CL 103 03/14/2021   CREATININE 0.71 03/14/2021   BUN 15 03/14/2021   CO2 23 03/14/2021   TSH 0.800 03/14/2021      Assessment & Plan:   Problem List Items Addressed This Visit       Cardiovascular and Mediastinum   Benign hypertension - Primary   Relevant Orders   CBC with Differential/Platelet   Comprehensive metabolic panel   TSH Continue current meds - monitor and follow up if persistently elevated     Other   Anxiety Take meds as directed   Mixed hyperlipidemia   Relevant Orders   Lipid panel Watch diet   Other Visit Diagnoses     Vitamin D insufficiency       Relevant Orders   VITAMIN D 25 Hydroxy  (Vit-D Deficiency, Fractures)   Other fatigue       Relevant Orders   CBC with Differential/Platelet   Comprehensive metabolic panel   TSH     .  No orders of the defined  types were placed in this encounter.   Orders Placed This Encounter  Procedures   CBC with Differential/Platelet   Comprehensive metabolic panel   TSH   Lipid panel   VITAMIN D 25 Hydroxy (Vit-D Deficiency, Fractures)     Follow-up: Return in about 6 months (around 03/17/2022) for chronic fasting follow up.  An After Visit Summary was printed and given to the patient.  Yetta Flock [facility] [phone]

## 2021-09-15 ENCOUNTER — Other Ambulatory Visit: Payer: Self-pay | Admitting: Physician Assistant

## 2021-09-15 ENCOUNTER — Other Ambulatory Visit: Payer: Self-pay

## 2021-09-15 DIAGNOSIS — E782 Mixed hyperlipidemia: Secondary | ICD-10-CM

## 2021-09-15 LAB — COMPREHENSIVE METABOLIC PANEL
ALT: 16 IU/L (ref 0–32)
AST: 19 IU/L (ref 0–40)
Albumin/Globulin Ratio: 2 (ref 1.2–2.2)
Albumin: 4.5 g/dL (ref 3.9–4.9)
Alkaline Phosphatase: 94 IU/L (ref 44–121)
BUN/Creatinine Ratio: 23 (ref 12–28)
BUN: 16 mg/dL (ref 8–27)
Bilirubin Total: 0.4 mg/dL (ref 0.0–1.2)
CO2: 23 mmol/L (ref 20–29)
Calcium: 9.9 mg/dL (ref 8.7–10.3)
Chloride: 102 mmol/L (ref 96–106)
Creatinine, Ser: 0.69 mg/dL (ref 0.57–1.00)
Globulin, Total: 2.3 g/dL (ref 1.5–4.5)
Glucose: 91 mg/dL (ref 70–99)
Potassium: 4.5 mmol/L (ref 3.5–5.2)
Sodium: 140 mmol/L (ref 134–144)
Total Protein: 6.8 g/dL (ref 6.0–8.5)
eGFR: 97 mL/min/{1.73_m2} (ref >59)

## 2021-09-15 LAB — CBC WITH DIFFERENTIAL/PLATELET
Basophils Absolute: 0 10*3/uL (ref 0.0–0.2)
Basos: 1 %
EOS (ABSOLUTE): 0.4 10*3/uL (ref 0.0–0.4)
Eos: 6 %
Hematocrit: 40.6 % (ref 34.0–46.6)
Hemoglobin: 13.4 g/dL (ref 11.1–15.9)
Immature Grans (Abs): 0 10*3/uL (ref 0.0–0.1)
Immature Granulocytes: 0 %
Lymphocytes Absolute: 1.7 10*3/uL (ref 0.7–3.1)
Lymphs: 24 %
MCH: 29.3 pg (ref 26.6–33.0)
MCHC: 33 g/dL (ref 31.5–35.7)
MCV: 89 fL (ref 79–97)
Monocytes Absolute: 0.4 10*3/uL (ref 0.1–0.9)
Monocytes: 6 %
Neutrophils Absolute: 4.4 10*3/uL (ref 1.4–7.0)
Neutrophils: 63 %
Platelets: 251 10*3/uL (ref 150–450)
RBC: 4.58 x10E6/uL (ref 3.77–5.28)
RDW: 12.7 % (ref 11.7–15.4)
WBC: 7 10*3/uL (ref 3.4–10.8)

## 2021-09-15 LAB — LIPID PANEL
Chol/HDL Ratio: 5.9 ratio — ABNORMAL HIGH (ref 0.0–4.4)
Cholesterol, Total: 284 mg/dL — ABNORMAL HIGH (ref 100–199)
HDL: 48 mg/dL (ref >39)
LDL Chol Calc (NIH): 209 mg/dL — ABNORMAL HIGH (ref 0–99)
Triglycerides: 145 mg/dL (ref 0–149)
VLDL Cholesterol Cal: 27 mg/dL (ref 5–40)

## 2021-09-15 LAB — TSH: TSH: 0.878 u[IU]/mL (ref 0.450–4.500)

## 2021-09-15 LAB — CARDIOVASCULAR RISK ASSESSMENT

## 2021-09-15 LAB — VITAMIN D 25 HYDROXY (VIT D DEFICIENCY, FRACTURES): Vit D, 25-Hydroxy: 32.4 ng/mL (ref 30.0–100.0)

## 2021-09-15 MED ORDER — ESCITALOPRAM OXALATE 20 MG PO TABS: 20.0000 mg | ORAL_TABLET | Freq: Every day | ORAL | 0 refills | Status: DC

## 2021-09-15 MED ORDER — PRAVASTATIN SODIUM 40 MG PO TABS: 40.0000 mg | ORAL_TABLET | Freq: Every day | ORAL | 1 refills | Status: DC

## 2021-09-15 MED ORDER — MELOXICAM 15 MG PO TABS: 15.0000 mg | ORAL_TABLET | Freq: Every day | ORAL | 1 refills | Status: DC

## 2021-09-19 ENCOUNTER — Encounter: Payer: Self-pay | Admitting: Physician Assistant

## 2021-10-06 ENCOUNTER — Other Ambulatory Visit: Payer: Self-pay | Admitting: Physician Assistant

## 2021-10-06 DIAGNOSIS — F419 Anxiety disorder, unspecified: Secondary | ICD-10-CM

## 2021-10-06 MED ORDER — ALPRAZOLAM 0.5 MG PO TABS: 0.5000 mg | ORAL_TABLET | Freq: Every evening | ORAL | 1 refills | Status: DC | PRN

## 2021-12-16 ENCOUNTER — Encounter: Payer: Self-pay | Admitting: Physician Assistant

## 2021-12-16 ENCOUNTER — Ambulatory Visit: Payer: Managed Care, Other (non HMO) | Admitting: Physician Assistant

## 2021-12-16 VITALS — BP 128/86 | HR 58 | Temp 98.4°F | Resp 15 | Ht 62.0 in | Wt 287.0 lb

## 2021-12-16 DIAGNOSIS — I10 Essential (primary) hypertension: Secondary | ICD-10-CM

## 2021-12-16 DIAGNOSIS — E782 Mixed hyperlipidemia: Secondary | ICD-10-CM

## 2021-12-16 DIAGNOSIS — Z23 Encounter for immunization: Secondary | ICD-10-CM

## 2021-12-16 DIAGNOSIS — F419 Anxiety disorder, unspecified: Secondary | ICD-10-CM

## 2021-12-16 DIAGNOSIS — E559 Vitamin D deficiency, unspecified: Secondary | ICD-10-CM | POA: Diagnosis not present

## 2021-12-16 DIAGNOSIS — N958 Other specified menopausal and perimenopausal disorders: Secondary | ICD-10-CM

## 2021-12-16 MED ORDER — LISINOPRIL-HYDROCHLOROTHIAZIDE 20-25 MG PO TABS: 1.0000 | ORAL_TABLET | Freq: Every day | ORAL | 1 refills | Status: DC

## 2021-12-16 NOTE — Progress Notes (Signed)
Subjective:  Patient ID: Joanna [name], female    DOB: 12-Oct-1955  Age: 66 y.o. MRN: [id number]  Chief Complaint  Patient presents with   Hypertension   Hyperlipidemia    Hypertension  Hyperlipidemia    Pt presents for follow up of hypertension. The patient is tolerating the medication well without side effects. Compliance with treatment has been good; including taking medication as directed , maintains a healthy diet and regular exercise regimen , and following up as directed. Currently taking zestoretic 20/12.'5mg'$  qd   Pt with history of vit D def - currently on supplement weekly - due for labwork  Pt with history of anxiety - she is doing well on lexapro and xanax as needed  Pt with history of osteoarthritis - using mobic '15mg'$  qd  Pt with history of insomnia - stable on trazodone '150mg'$  qd  Pt would like flu vaccine and pneumonia vaccine today Would like to schedule dexa scan Current Outpatient Medications on File Prior to Visit  Medication Sig Dispense Refill   ALPRAZolam (XANAX) 0.5 MG tablet Take 1 tablet (0.5 mg total) by mouth at bedtime as needed for anxiety. 30 tablet 1   aspirin 81 MG EC tablet Take by mouth.     escitalopram (LEXAPRO) 20 MG tablet Take 1 tablet (20 mg total) by mouth daily. 90 tablet 0   meloxicam (MOBIC) 15 MG tablet Take 1 tablet (15 mg total) by mouth daily. 90 tablet 1   pravastatin (PRAVACHOL) 40 MG tablet Take 1 tablet (40 mg total) by mouth daily. 90 tablet 1   traZODone (DESYREL) 150 MG tablet TAKE 1 TABLET BY MOUTH AT BEDTIME 90 tablet 1   Vitamin D, Ergocalciferol, (DRISDOL) 1.25 MG (50000 UNIT) CAPS capsule Take 1 capsule (50,000 Units total) by mouth 2 (two) times a week. 24 capsule 1   No current facility-administered medications on file prior to visit.   Past Medical History:  Diagnosis Date   Angina    "related to stress"   Arthritis    "fingers"   Cancer ([place]) 07/2010   left breast   Difficult intubation    "short neck"    GERD (gastroesophageal reflux disease)    H/O hiatal hernia    Headache(784.0)    History of migraines 01/26/11   "it's been several years since my last one"   Hypercholesteremia    Hypertension    echo done 2012  [place] [facility] in [place]   PONV (postoperative nausea and vomiting)    Recurrent upper respiratory infection (URI)    sinus infection-finishing up antibiotic 01/24/11   Sleep apnea ~ 2009   sleep study done ; [place]   Past Surgical History:  Procedure Laterality Date   [place]  01/25/11   right breast "reduced & lifted"   BREAST REDUCTION SURGERY  01/25/2011   Procedure: BREAST REDUCTION WITH LIPOSUCTION;  Surgeon: Macon Large;  Location: [facility];  Service: Plastics;  Laterality: Right;  Right Breast Reduction    BREAST SURGERY  08/2010   left mastectomy-patient still has drainage tube in   BREAST SURGERY  01/25/11   right reduction & lift   BUNIONECTOMY  ~ 2009   left   CARPAL TUNNEL RELEASE  1981-1984   bilaterally   CHOLECYSTECTOMY     HAMMER TOE SURGERY  ~ 2009   left   LATISSIMUS FLAP TO BREAST  12/13/2010   Procedure: LATISSIMUS FLAP  TO BREAST;  Surgeon: Macon Large;  Location: [facility];  Service: Plastics;  Laterality: Left;  LEFT LATISSIMUS FLAP WITH IMPLANT    History reviewed. No pertinent family history. Social History   Socioeconomic History   Marital status: Married    Spouse name: Not on file   Number of children: Not on file   Years of education: Not on file   Highest education level: Not on file  Occupational History   Not on file  Tobacco Use   Smoking status: Never   Smokeless tobacco: Never  Substance and Sexual Activity   Alcohol use: Yes    Comment: occ.; haven't drank anything since 01/2010 (01/26/11)   Drug use: No   Sexual activity: Yes  Other Topics Concern   Not on file  Social History Narrative   Not on file   Social Determinants of Health    Financial Resource Strain: Not on file  Food Insecurity: Not on file  Transportation Needs: Not on file  Physical Activity: Not on file  Stress: Not on file  Social Connections: Not on file    Review of Systems . CONSTITUTIONAL: Negative for chills, fatigue, fever, unintentional weight gain and unintentional weight loss.  E/N/T: Negative for ear pain, nasal congestion and sore throat.  CARDIOVASCULAR: Negative for chest pain, dizziness, palpitations and pedal edema.  RESPIRATORY: Negative for recent cough and dyspnea.  GASTROINTESTINAL: Negative for abdominal pain, acid reflux symptoms, constipation, diarrhea, nausea and vomiting.  MSK: Negative for arthralgias and myalgias.  INTEGUMENTARY: Negative for rash.  NEUROLOGICAL: Negative for dizziness and headaches.  PSYCHIATRIC: Negative for sleep disturbance and to question depression screen.  Negative for depression, negative for anhedonia.       Objective:  PHYSICAL EXAM:   VS: BP 128/86   Pulse (!) 58   Temp 98.4 F (36.9 C)   Resp 15   Ht '5\' 2"'$  (1.575 m)   Wt 287 lb (130.2 kg)   SpO2 95%   BMI 52.49 kg/m   GEN: Well nourished, well developed, in no acute distress  Cardiac: RRR; no murmurs, rubs, or gallops,no edema -  Respiratory:  normal respiratory rate and pattern with no distress - normal breath sounds with no rales, rhonchi, wheezes or rubs MS: no deformity or atrophy  Skin: warm and dry, no rash  Psych: euthymic mood, appropriate affect and demeanor   Lab Results  Component Value Date   WBC 7.0 09/14/2021   HGB 13.4 09/14/2021   HCT 40.6 09/14/2021   PLT 251 09/14/2021   GLUCOSE 91 09/14/2021   CHOL 284 (H) 09/14/2021   TRIG 145 09/14/2021   HDL 48 09/14/2021   LDLCALC 209 (H) 09/14/2021   ALT 16 09/14/2021   AST 19 09/14/2021   NA 140 09/14/2021   K 4.5 09/14/2021   CL 102 09/14/2021   CREATININE 0.69 09/14/2021   BUN 16 09/14/2021   CO2 23 09/14/2021   TSH 0.878 09/14/2021       Assessment & Plan:   Problem List Items Addressed This Visit       Cardiovascular and Mediastinum   Benign hypertension - Primary - uncontrolled   Relevant Orders   CBC with Differential/Platelet   Comprehensive metabolic panel   TSH Increase to lisinopril/hct 20/25     Other   Anxiety Take meds as directed    Mixed hyperlipidemia   Relevant Orders   Lipid panel Watch diet Continue current meds   Other Visit Diagnoses  Vitamin D insufficiency       Relevant Orders   VITAMIN D 25 Hydroxy (Vit-D Deficiency, Fractures)   Other fatigue       Relevant Orders   CBC with Differential/Platelet   Comprehensive metabolic panel   TSH     .  Meds ordered this encounter  Medications   lisinopril-hydrochlorothiazide (ZESTORETIC) 20-25 MG tablet    Sig: Take 1 tablet by mouth daily.    Dispense:  90 tablet    Refill:  1    Order Specific Question:   Supervising Provider    AnswerShelton Silvas    Orders Placed This Encounter  Procedures   DG Bone Density   Flu Vaccine QUAD High Dose(Fluad)   Pneumococcal conjugate vaccine 20-valent (Prevnar 20)   CBC with Differential/Platelet   Comprehensive metabolic panel   TSH   Lipid panel   VITAMIN D 25 Hydroxy (Vit-D Deficiency, Fractures)     Follow-up: Return in about 6 months (around 06/16/2022) for chronic fasting follow up - 1 month bp check with nurse.  An After Visit Summary was printed and given to the patient.  Yetta Flock [facility] [phone]

## 2021-12-17 LAB — CBC WITH DIFFERENTIAL/PLATELET
Basophils Absolute: 0.1 10*3/uL (ref 0.0–0.2)
Basos: 1 %
EOS (ABSOLUTE): 0.3 10*3/uL (ref 0.0–0.4)
Eos: 4 %
Hematocrit: 40.1 % (ref 34.0–46.6)
Hemoglobin: 13.5 g/dL (ref 11.1–15.9)
Immature Grans (Abs): 0 10*3/uL (ref 0.0–0.1)
Immature Granulocytes: 0 %
Lymphocytes Absolute: 1.8 10*3/uL (ref 0.7–3.1)
Lymphs: 23 %
MCH: 29.5 pg (ref 26.6–33.0)
MCHC: 33.7 g/dL (ref 31.5–35.7)
MCV: 88 fL (ref 79–97)
Monocytes Absolute: 0.5 10*3/uL (ref 0.1–0.9)
Monocytes: 6 %
Neutrophils Absolute: 5 10*3/uL (ref 1.4–7.0)
Neutrophils: 66 %
Platelets: 262 10*3/uL (ref 150–450)
RBC: 4.57 x10E6/uL (ref 3.77–5.28)
RDW: 13 % (ref 11.7–15.4)
WBC: 7.7 10*3/uL (ref 3.4–10.8)

## 2021-12-17 LAB — COMPREHENSIVE METABOLIC PANEL
ALT: 17 IU/L (ref 0–32)
AST: 21 IU/L (ref 0–40)
Albumin/Globulin Ratio: 1.8 (ref 1.2–2.2)
Albumin: 4.4 g/dL (ref 3.9–4.9)
Alkaline Phosphatase: 96 IU/L (ref 44–121)
BUN/Creatinine Ratio: 18 (ref 12–28)
BUN: 12 mg/dL (ref 8–27)
Bilirubin Total: 0.4 mg/dL (ref 0.0–1.2)
CO2: 24 mmol/L (ref 20–29)
Calcium: 9.7 mg/dL (ref 8.7–10.3)
Chloride: 104 mmol/L (ref 96–106)
Creatinine, Ser: 0.65 mg/dL (ref 0.57–1.00)
Globulin, Total: 2.5 g/dL (ref 1.5–4.5)
Glucose: 84 mg/dL (ref 70–99)
Potassium: 4.3 mmol/L (ref 3.5–5.2)
Sodium: 141 mmol/L (ref 134–144)
Total Protein: 6.9 g/dL (ref 6.0–8.5)
eGFR: 98 mL/min/{1.73_m2} (ref >59)

## 2021-12-17 LAB — LIPID PANEL
Chol/HDL Ratio: 5.3 ratio — ABNORMAL HIGH (ref 0.0–4.4)
Cholesterol, Total: 298 mg/dL — ABNORMAL HIGH (ref 100–199)
HDL: 56 mg/dL
LDL Chol Calc (NIH): 223 mg/dL — ABNORMAL HIGH (ref 0–99)
Triglycerides: 108 mg/dL (ref 0–149)
VLDL Cholesterol Cal: 19 mg/dL (ref 5–40)

## 2021-12-17 LAB — VITAMIN D 25 HYDROXY (VIT D DEFICIENCY, FRACTURES): Vit D, 25-Hydroxy: 29.9 ng/mL — ABNORMAL LOW (ref 30.0–100.0)

## 2021-12-17 LAB — CARDIOVASCULAR RISK ASSESSMENT

## 2021-12-17 LAB — TSH: TSH: 0.813 u[IU]/mL (ref 0.450–4.500)

## 2021-12-18 ENCOUNTER — Other Ambulatory Visit: Payer: Self-pay | Admitting: Physician Assistant

## 2021-12-18 MED ORDER — ATORVASTATIN CALCIUM 40 MG PO TABS: 40.0000 mg | ORAL_TABLET | Freq: Every day | ORAL | 1 refills | Status: DC

## 2021-12-19 ENCOUNTER — Other Ambulatory Visit: Payer: Self-pay | Admitting: Physician Assistant

## 2021-12-19 DIAGNOSIS — F419 Anxiety disorder, unspecified: Secondary | ICD-10-CM

## 2021-12-19 MED ORDER — ALPRAZOLAM 0.5 MG PO TABS: 0.5000 mg | ORAL_TABLET | Freq: Every evening | ORAL | 1 refills | Status: DC | PRN

## 2021-12-19 MED ORDER — TRAZODONE HCL 150 MG PO TABS: 150.0000 mg | ORAL_TABLET | Freq: Every day | ORAL | 1 refills | Status: DC

## 2022-01-04 ENCOUNTER — Encounter: Payer: Self-pay | Admitting: Physician Assistant

## 2022-02-06 ENCOUNTER — Other Ambulatory Visit: Payer: Self-pay

## 2022-02-06 MED ORDER — ESCITALOPRAM OXALATE 20 MG PO TABS: 20.0000 mg | ORAL_TABLET | Freq: Every day | ORAL | 0 refills | Status: DC

## 2022-03-06 ENCOUNTER — Other Ambulatory Visit: Payer: Self-pay

## 2022-03-06 DIAGNOSIS — F419 Anxiety disorder, unspecified: Secondary | ICD-10-CM

## 2022-03-06 DIAGNOSIS — E559 Vitamin D deficiency, unspecified: Secondary | ICD-10-CM

## 2022-03-06 MED ORDER — VITAMIN D (ERGOCALCIFEROL) 1.25 MG (50000 UNIT) PO CAPS: 50000.0000 [IU] | ORAL_CAPSULE | ORAL | 1 refills | Status: DC

## 2022-03-06 MED ORDER — ALPRAZOLAM 0.5 MG PO TABS: 0.5000 mg | ORAL_TABLET | Freq: Every evening | ORAL | 1 refills | Status: DC | PRN

## 2022-03-21 ENCOUNTER — Ambulatory Visit: Payer: Managed Care, Other (non HMO) | Admitting: Physician Assistant

## 2022-04-05 ENCOUNTER — Encounter: Payer: Self-pay | Admitting: Physician Assistant

## 2022-04-05 ENCOUNTER — Ambulatory Visit: Payer: Managed Care, Other (non HMO) | Admitting: Physician Assistant

## 2022-04-05 VITALS — BP 120/80 | HR 67 | Temp 98.2°F | Resp 14 | Ht 62.0 in | Wt 286.0 lb

## 2022-04-05 DIAGNOSIS — E782 Mixed hyperlipidemia: Secondary | ICD-10-CM | POA: Diagnosis not present

## 2022-04-05 DIAGNOSIS — E559 Vitamin D deficiency, unspecified: Secondary | ICD-10-CM

## 2022-04-05 DIAGNOSIS — F419 Anxiety disorder, unspecified: Secondary | ICD-10-CM

## 2022-04-05 DIAGNOSIS — I10 Essential (primary) hypertension: Secondary | ICD-10-CM

## 2022-04-05 MED ORDER — METOPROLOL SUCCINATE ER 25 MG PO TB24: 25.0000 mg | ORAL_TABLET | Freq: Every day | ORAL | 2 refills | Status: DC

## 2022-04-05 NOTE — Progress Notes (Signed)
Subjective:  Patient ID: Joanna [name], female    DOB: [birth year]  Age: 67 y.o. MRN: [id number]  Chief Complaint  Patient presents with   Hypertension   Hyperlipidemia    Hypertension  Hyperlipidemia    Pt presents for follow up of hypertension. The patient is tolerating the medication well without side effects. Compliance with treatment has been good; including taking medication as directed , maintains a healthy diet and regular exercise regimen , and following up as directed. Currently taking zestoretic 20/12.'5mg'$  qd and toprol XL '25mg'$  qd Which she takes for history of palpitations  Pt with history of vit D def - currently on supplement twice weekly - due for labwork  Pt with history of anxiety - she is doing well on lexapro and xanax as needed  Pt with history of osteoarthritis - using lodine '400mg'$   Pt with history of insomnia - stable on trazodone '150mg'$  qd  Pt with history of hyperlipidemia - at last visit she was changed from pravachol to lipitor - she is not fasting today but will return for labwork Current Outpatient Medications on File Prior to Visit  Medication Sig Dispense Refill   ALPRAZolam (XANAX) 0.5 MG tablet Take 1 tablet (0.5 mg total) by mouth at bedtime as needed for anxiety. 30 tablet 1   aspirin 81 MG EC tablet Take by mouth.     atorvastatin (LIPITOR) 40 MG tablet Take 1 tablet (40 mg total) by mouth daily. 90 tablet 1   escitalopram (LEXAPRO) 20 MG tablet Take 1 tablet (20 mg total) by mouth daily. 90 tablet 0   etodolac (LODINE) 400 MG tablet Take by mouth.     lisinopril-hydrochlorothiazide (ZESTORETIC) 20-25 MG tablet Take 1 tablet by mouth daily. 90 tablet 1   montelukast (SINGULAIR) 10 MG tablet Take by mouth.     traZODone (DESYREL) 150 MG tablet Take 1 tablet (150 mg total) by mouth at bedtime. 90 tablet 1   Vitamin D, Ergocalciferol, (DRISDOL) 1.25 MG (50000 UNIT) CAPS capsule Take 1 capsule (50,000 Units total) by mouth 2 (two) times a week. 24  capsule 1   No current facility-administered medications on file prior to visit.   Past Medical History:  Diagnosis Date   Angina    "related to stress"   Arthritis    "fingers"   Cancer ([place]) 07/2010   left breast   Difficult intubation    "short neck"   GERD (gastroesophageal reflux disease)    H/O hiatal hernia    Headache(784.0)    History of migraines 01/26/11   "it's been several years since my last one"   Hypercholesteremia    Hypertension    echo done 2012  [facility] in [place]   PONV (postoperative nausea and vomiting)    Recurrent upper respiratory infection (URI)    sinus infection-finishing up antibiotic 01/24/11   Sleep apnea ~ 2009   sleep study done ; [place]   Past Surgical History:  Procedure Laterality Date   [place]  01/25/11   right breast "reduced & lifted"   BREAST REDUCTION SURGERY  01/25/2011   Procedure: BREAST REDUCTION WITH LIPOSUCTION;  Surgeon: Macon Large;  Location: [place];  Service: Plastics;  Laterality: Right;  Right Breast Reduction    BREAST SURGERY  08/2010   left mastectomy-patient still has drainage tube in   BREAST SURGERY  01/25/11   right reduction & lift  BUNIONECTOMY  ~ 2009   left   CARPAL TUNNEL RELEASE  1981-1984   bilaterally   CHOLECYSTECTOMY     HAMMER TOE SURGERY  ~ 2009   left   LATISSIMUS FLAP TO BREAST  12/13/2010   Procedure: LATISSIMUS FLAP TO BREAST;  Surgeon: Macon Large;  Location: [place];  Service: Plastics;  Laterality: Left;  LEFT LATISSIMUS FLAP WITH IMPLANT    History reviewed. No pertinent family history. Social History   Socioeconomic History   Marital status: Married    Spouse name: Not on file   Number of children: Not on file   Years of education: Not on file   Highest education level: Not on file  Occupational History   Not on file  Tobacco Use   Smoking status: Never   Smokeless tobacco: Never  Substance  and Sexual Activity   Alcohol use: Yes    Comment: occ.; haven't drank anything since 01/2010 (01/26/11)   Drug use: No   Sexual activity: Yes  Other Topics Concern   Not on file  Social History Narrative   Not on file   Social Determinants of Health   Financial Resource Strain: Not on file  Food Insecurity: Not on file  Transportation Needs: Not on file  Physical Activity: Not on file  Stress: Not on file  Social Connections: Not on file   CONSTITUTIONAL: Negative for chills, fatigue, fever,  E/N/T: Negative for ear pain, nasal congestion and sore throat.  CARDIOVASCULAR: Negative for chest pain, dizziness, palpitations .  RESPIRATORY: Negative for recent cough and dyspnea.  GASTROINTESTINAL: Negative for abdominal pain, acid reflux symptoms, constipation, diarrhea, nausea and vomiting.  MSK: Negative for arthralgias and myalgias.  INTEGUMENTARY: Negative for rash.  NEUROLOGICAL: Negative for dizziness and headaches.  PSYCHIATRIC: Negative for sleep disturbance and to question depression screen.  Negative for depression, negative for anhedonia.       Objective:  PHYSICAL EXAM:   VS: BP 120/80   Pulse 67   Temp 98.2 F (36.8 C)   Resp 14   Ht '5\' 2"'$  (1.575 m)   Wt 286 lb (129.7 kg)   SpO2 91%   BMI 52.31 kg/m   GEN: Well nourished, well developed, in no acute distress  Cardiac: RRR; no murmurs, rubs, or gallops,no edema - Respiratory:  normal respiratory rate and pattern with no distress - normal breath sounds with no rales, rhonchi, wheezes or rubs GI: normal bowel sounds, no masses or tenderness Skin: warm and dry, no rash  Psych: euthymic mood, appropriate affect and demeanor  Lab Results  Component Value Date   WBC 7.7 12/16/2021   HGB 13.5 12/16/2021   HCT 40.1 12/16/2021   PLT 262 12/16/2021   GLUCOSE 84 12/16/2021   CHOL 298 (H) 12/16/2021   TRIG 108 12/16/2021   HDL 56 12/16/2021   LDLCALC 223 (H) 12/16/2021   ALT 17 12/16/2021   AST 21 12/16/2021    NA 141 12/16/2021   K 4.3 12/16/2021   CL 104 12/16/2021   CREATININE 0.65 12/16/2021   BUN 12 12/16/2021   CO2 24 12/16/2021   TSH 0.813 12/16/2021      Assessment & Plan:   Problem List Items Addressed This Visit       Cardiovascular and Mediastinum   Benign hypertension - Primary - uncontrolled   Relevant Orders   CBC with Differential/Platelet   Comprehensive metabolic panel   TSH Continue current meds     Other   Anxiety  Take meds as directed    Mixed hyperlipidemia   Relevant Orders   Lipid panel Watch diet Continue current meds   Other Visit Diagnoses     Vitamin D insufficiency       Relevant Orders   VITAMIN D 25 Hydroxy (Vit-D Deficiency, Fractures)                    .  Meds ordered this encounter  Medications   metoprolol succinate (TOPROL-XL) 25 MG 24 hr tablet    Sig: Take 1 tablet (25 mg total) by mouth daily.    Dispense:  30 tablet    Refill:  2    Order Specific Question:   Supervising Provider    AnswerShelton Silvas    Orders Placed This Encounter  Procedures   CBC with Differential/Platelet   Comprehensive metabolic panel   TSH   Lipid panel   VITAMIN D 25 Hydroxy (Vit-D Deficiency, Fractures)     Follow-up: Return in about 6 months (around 10/06/2022) for chronic fasting follow up --- lab visit next Friday.  An After Visit Summary was printed and given to the patient.  Yetta Flock [facility] [phone]

## 2022-04-14 ENCOUNTER — Other Ambulatory Visit: Payer: Managed Care, Other (non HMO)

## 2022-04-14 DIAGNOSIS — E782 Mixed hyperlipidemia: Secondary | ICD-10-CM

## 2022-04-14 DIAGNOSIS — F419 Anxiety disorder, unspecified: Secondary | ICD-10-CM

## 2022-04-14 DIAGNOSIS — E559 Vitamin D deficiency, unspecified: Secondary | ICD-10-CM

## 2022-04-14 DIAGNOSIS — I10 Essential (primary) hypertension: Secondary | ICD-10-CM

## 2022-04-15 LAB — COMPREHENSIVE METABOLIC PANEL
ALT: 17 IU/L (ref 0–32)
AST: 18 IU/L (ref 0–40)
Albumin/Globulin Ratio: 1.8 (ref 1.2–2.2)
Albumin: 4.4 g/dL (ref 3.9–4.9)
Alkaline Phosphatase: 109 IU/L (ref 44–121)
BUN/Creatinine Ratio: 17 (ref 12–28)
BUN: 15 mg/dL (ref 8–27)
Bilirubin Total: 0.8 mg/dL (ref 0.0–1.2)
CO2: 22 mmol/L (ref 20–29)
Calcium: 9.9 mg/dL (ref 8.7–10.3)
Chloride: 100 mmol/L (ref 96–106)
Creatinine, Ser: 0.88 mg/dL (ref 0.57–1.00)
Globulin, Total: 2.4 g/dL (ref 1.5–4.5)
Glucose: 83 mg/dL (ref 70–99)
Potassium: 4.8 mmol/L (ref 3.5–5.2)
Sodium: 139 mmol/L (ref 134–144)
Total Protein: 6.8 g/dL (ref 6.0–8.5)
eGFR: 73 mL/min/{1.73_m2} (ref >59)

## 2022-04-15 LAB — CBC WITH DIFFERENTIAL/PLATELET
Basophils Absolute: 0.1 10*3/uL (ref 0.0–0.2)
Basos: 1 %
EOS (ABSOLUTE): 0.3 10*3/uL (ref 0.0–0.4)
Eos: 4 %
Hematocrit: 41.5 % (ref 34.0–46.6)
Hemoglobin: 14 g/dL (ref 11.1–15.9)
Immature Grans (Abs): 0 10*3/uL (ref 0.0–0.1)
Immature Granulocytes: 0 %
Lymphocytes Absolute: 1.8 10*3/uL (ref 0.7–3.1)
Lymphs: 23 %
MCH: 29.4 pg (ref 26.6–33.0)
MCHC: 33.7 g/dL (ref 31.5–35.7)
MCV: 87 fL (ref 79–97)
Monocytes Absolute: 0.5 10*3/uL (ref 0.1–0.9)
Monocytes: 7 %
Neutrophils Absolute: 5.1 10*3/uL (ref 1.4–7.0)
Neutrophils: 65 %
Platelets: 280 10*3/uL (ref 150–450)
RBC: 4.76 x10E6/uL (ref 3.77–5.28)
RDW: 12.9 % (ref 11.7–15.4)
WBC: 7.7 10*3/uL (ref 3.4–10.8)

## 2022-04-15 LAB — LIPID PANEL
Chol/HDL Ratio: 4.8 ratio — ABNORMAL HIGH (ref 0.0–4.4)
Cholesterol, Total: 246 mg/dL — ABNORMAL HIGH (ref 100–199)
HDL: 51 mg/dL (ref >39)
LDL Chol Calc (NIH): 177 mg/dL — ABNORMAL HIGH (ref 0–99)
Triglycerides: 104 mg/dL (ref 0–149)
VLDL Cholesterol Cal: 18 mg/dL (ref 5–40)

## 2022-04-15 LAB — TSH: TSH: 1.18 u[IU]/mL (ref 0.450–4.500)

## 2022-04-15 LAB — VITAMIN D 25 HYDROXY (VIT D DEFICIENCY, FRACTURES): Vit D, 25-Hydroxy: 49.5 ng/mL (ref 30.0–100.0)

## 2022-04-15 LAB — CARDIOVASCULAR RISK ASSESSMENT

## 2022-05-04 ENCOUNTER — Ambulatory Visit (INDEPENDENT_AMBULATORY_CARE_PROVIDER_SITE_OTHER): Payer: Managed Care, Other (non HMO) | Admitting: Family Medicine

## 2022-05-04 VITALS — BP 154/88 | HR 55 | Temp 97.8°F | Ht 62.0 in | Wt 286.0 lb

## 2022-05-04 DIAGNOSIS — I10 Essential (primary) hypertension: Secondary | ICD-10-CM

## 2022-05-04 DIAGNOSIS — R079 Chest pain, unspecified: Secondary | ICD-10-CM

## 2022-05-04 DIAGNOSIS — R001 Bradycardia, unspecified: Secondary | ICD-10-CM

## 2022-05-04 DIAGNOSIS — F419 Anxiety disorder, unspecified: Secondary | ICD-10-CM

## 2022-05-04 NOTE — Patient Instructions (Addendum)
Hold Toprol-XL.  If has palpitations over the weekend may take a half a pill. Increase lisinopril/hydrochlorothiazide to 20/25 mg 2 daily.

## 2022-05-04 NOTE — Progress Notes (Signed)
Acute Office Visit  Subjective:    Patient ID: Joanna [name], female    DOB: 1955/02/26, 67 y.o.   MRN: [id number]  Chief Complaint  Patient presents with   Hypertension    HPI: Patient walked in for blood pressure check.  She had a very stressful day.  She was in the office earlier around lunchtime with her mother for her mother's doctor's appointment.  Then she proceeded to run errands with her mother as well as her mother having bowel incontinence requiring significant cleanout.  She then was in her mother was very warm home and felt like she got overheated and felt she was going to pass out.  She did not pass out.  They did decrease the temperature in the home and this seemed to help a little bit.  She is having palpitations also.  She felt like her heart was fluttering.  Patient is very stressed out.  Currently on Lexapro 20 mg daily and Xanax which she only takes at bedtime.  2 weeks ago Joanna Pebbles, PA-C added hydrochlorothiazide to her lisinopril.  She takes Toprol-XL 25 mg a half a pill a day for PVCs.  She had seen Dr. Judithe [name] for this sometime ago.  Recently Joanna [name] has been managing this.  Past Medical History:  Diagnosis Date   Angina    "related to stress"   Arthritis    "fingers"   Cancer 07/2010   left breast   Difficult intubation    "short neck"   GERD (gastroesophageal reflux disease)    H/O hiatal hernia    Headache(784.0)    History of migraines 01/26/11   "it's been several years since my last one"   Hypercholesteremia    Hypertension    echo done 2012  [name] [facility] [name]   PONV (postoperative nausea and vomiting)    Recurrent upper respiratory infection (URI)    sinus infection-finishing up antibiotic 01/24/11   Sleep apnea ~ 2009   sleep study done ; [place]    Past Surgical History:  Procedure Laterality Date   ABDOMINAL HYSTERECTOMY  1999   APPENDECTOMY  1999   BREAST REDUCTION SURGERY  01/25/11   right breast "reduced & lifted"   BREAST  REDUCTION SURGERY  01/25/2011   Procedure: BREAST REDUCTION WITH LIPOSUCTION;  Surgeon: Rossie Muskrat;  [facility];  Service: Plastics;  Laterality: Right;  Right Breast Reduction    BREAST SURGERY  08/2010   left mastectomy-patient still has drainage tube in   BREAST SURGERY  01/25/11   right reduction & lift   BUNIONECTOMY  ~ 2009   left   CARPAL TUNNEL RELEASE  1981-1984   bilaterally   CHOLECYSTECTOMY     HAMMER TOE SURGERY  ~ 2009   left   LATISSIMUS FLAP TO BREAST  12/13/2010   Procedure: LATISSIMUS FLAP TO BREAST;  Surgeon: Rossie Muskrat;  [facility];  Service: Plastics;  Laterality: Left;  LEFT LATISSIMUS FLAP WITH IMPLANT    History reviewed. No pertinent family history.  Social History   Socioeconomic History   Marital status: Married    Spouse name: Not on file   Number of children: Not on file   Years of education: Not on file   Highest education level: Not on file  Occupational History   Not on file  Tobacco Use   Smoking status: Never   Smokeless tobacco: Never  Substance and Sexual Activity   Alcohol use: Yes  Comment: occ.; haven't drank anything since 01/2010 (01/26/11)   Drug use: No   Sexual activity: Yes  Other Topics Concern   Not on file  Social History Narrative   Not on file   Social Determinants of Health   Financial Resource Strain: Not on file  Food Insecurity: Not on file  Transportation Needs: Not on file  Physical Activity: Not on file  Stress: Not on file  Social Connections: Not on file  Intimate Partner Violence: Not on file    Outpatient Medications Prior to Visit  Medication Sig Dispense Refill   ALPRAZolam (XANAX) 0.5 MG tablet Take 1 tablet (0.5 mg total) by mouth at bedtime as needed for anxiety. 30 tablet 1   aspirin 81 MG EC tablet Take by mouth.     atorvastatin (LIPITOR) 40 MG tablet Take 1 tablet (40 mg total) by mouth daily. 90 tablet 1   escitalopram (LEXAPRO) 20 MG tablet Take 1 tablet (20 mg total) by  mouth daily. 90 tablet 0   lisinopril-hydrochlorothiazide (ZESTORETIC) 20-25 MG tablet Take 1 tablet by mouth daily. 90 tablet 1   metoprolol succinate (TOPROL-XL) 25 MG 24 hr tablet Take 1 tablet (25 mg total) by mouth daily. 30 tablet 2   montelukast (SINGULAIR) 10 MG tablet Take by mouth.     traZODone (DESYREL) 150 MG tablet Take 1 tablet (150 mg total) by mouth at bedtime. 90 tablet 1   Vitamin D, Ergocalciferol, (DRISDOL) 1.25 MG (50000 UNIT) CAPS capsule Take 1 capsule (50,000 Units total) by mouth 2 (two) times a week. 24 capsule 1   No facility-administered medications prior to visit.    Allergies  Allergen Reactions   Iodinated Contrast Media Rash    EATS SHELLFISH WITH NO PROBLEMS   Latex Rash   Meperidine Hcl Nausea And Vomiting   Penicillins Rash   Shellfish Allergy Rash   Shellfish-Derived Products Rash    EATS SHELLFISH WITH NO PROBLEMS, EATS SHELLFISH WITH NO PROBLEMS, EATS SHELLFISH WITH NO PROBLEMS, , EATS SHELLFISH WITH NO PROBLEMS, EATS SHELLFISH WITH NO PROBLEMS   Demerol [Meperidine Hcl] Nausea And Vomiting   Other Rash   Wound Dressing Adhesive Rash    Review of Systems  Respiratory:  Negative for chest tightness and shortness of breath.   Cardiovascular:  Positive for palpitations.       Objective:        05/04/2022    5:26 PM 05/04/2022    5:14 PM 04/05/2022    2:22 PM  Vitals with BMI  Height  5\' 2"  5\' 2"   Weight  286 lbs 286 lbs  BMI  52.3 52.3  Systolic 154 158 427  Diastolic 88 96 80  Pulse  55 67    Orthostatic VS for the past 72 hrs (Last 3 readings):  Patient Position BP Location  05/04/22 1714 Sitting Left Arm     Physical Exam Vitals reviewed.  Constitutional:      Appearance: Normal appearance. She is obese.  Cardiovascular:     Rate and Rhythm: Normal rate and regular rhythm.     Heart sounds: Normal heart sounds.  Pulmonary:     Effort: Pulmonary effort is normal. No respiratory distress.     Breath sounds: Normal breath  sounds.  Neurological:     Mental Status: She is alert and oriented to person, place, and time.  Psychiatric:        Mood and Affect: Mood normal.        Behavior: Behavior  normal.    Lab Results  Component Value Date   TSH 1.180 04/14/2022   Lab Results  Component Value Date   WBC 7.7 04/14/2022   HGB 14.0 04/14/2022   HCT 41.5 04/14/2022   MCV 87 04/14/2022   PLT 280 04/14/2022   Lab Results  Component Value Date   NA 139 04/14/2022   K 4.8 04/14/2022   CO2 22 04/14/2022   GLUCOSE 83 04/14/2022   BUN 15 04/14/2022   CREATININE 0.88 04/14/2022   BILITOT 0.8 04/14/2022   ALKPHOS 109 04/14/2022   AST 18 04/14/2022   ALT 17 04/14/2022   PROT 6.8 04/14/2022   ALBUMIN 4.4 04/14/2022   CALCIUM 9.9 04/14/2022   EGFR 73 04/14/2022   Lab Results  Component Value Date   CHOL 246 (H) 04/14/2022   Lab Results  Component Value Date   HDL 51 04/14/2022   Lab Results  Component Value Date   LDLCALC 177 (H) 04/14/2022   Lab Results  Component Value Date   TRIG 104 04/14/2022   Lab Results  Component Value Date   CHOLHDL 4.8 (H) 04/14/2022   No results found for: "HGBA1C"     Assessment & Plan:  Chest pain, unspecified type Assessment & Plan: Ordered EKG. NO ischemia. Bradycardia.  History of normal coronary work up.    Orders: -     EKG 12-Lead  Benign hypertension Assessment & Plan: Hold Toprol-XL.  If has palpitations over the weekend may take a half a pill. Increase lisinopril/hydrochlorothiazide to 20/25 mg 2 daily.  Orders: -     EKG 12-Lead  Bradycardia Assessment & Plan: Hold Toprol-XL.  If has palpitations over the weekend may take a half a pill.    Anxiety Assessment & Plan: Palpitations likely secondary to anxiety      No orders of the defined types were placed in this encounter.   Orders Placed This Encounter  Procedures   EKG 12-Lead     Follow-up: No follow-ups on file.  An After Visit Summary was printed and given to  the patient.  Clayborn Bigness I Leal-Borjas,acting as a scribe for Blane Ohara, MD.,have documented all relevant documentation on the behalf of Blane Ohara, MD,as directed by  Blane Ohara, MD while in the presence of Blane Ohara, MD.    Blane Ohara, MD [facility] [phone]

## 2022-05-06 ENCOUNTER — Encounter: Payer: Self-pay | Admitting: Family Medicine

## 2022-05-06 DIAGNOSIS — R079 Chest pain, unspecified: Secondary | ICD-10-CM | POA: Insufficient documentation

## 2022-05-06 DIAGNOSIS — R001 Bradycardia, unspecified: Secondary | ICD-10-CM | POA: Insufficient documentation

## 2022-05-06 NOTE — Assessment & Plan Note (Signed)
Palpitations likely secondary to anxiety

## 2022-05-06 NOTE — Assessment & Plan Note (Signed)
Hold Toprol-XL.  If has palpitations over the weekend may take a half a pill.

## 2022-05-06 NOTE — Assessment & Plan Note (Signed)
Hold Toprol-XL.  If has palpitations over the weekend may take a half a pill. Increase lisinopril/hydrochlorothiazide to 20/25 mg 2 daily. 

## 2022-05-06 NOTE — Assessment & Plan Note (Addendum)
Ordered EKG. NO ischemia. Bradycardia.  History of normal coronary work up.

## 2022-05-08 ENCOUNTER — Other Ambulatory Visit: Payer: Self-pay | Admitting: Physician Assistant

## 2022-05-08 DIAGNOSIS — F419 Anxiety disorder, unspecified: Secondary | ICD-10-CM

## 2022-05-18 ENCOUNTER — Ambulatory Visit: Payer: Managed Care, Other (non HMO)

## 2022-05-19 ENCOUNTER — Ambulatory Visit (INDEPENDENT_AMBULATORY_CARE_PROVIDER_SITE_OTHER): Payer: Managed Care, Other (non HMO)

## 2022-05-19 DIAGNOSIS — I10 Essential (primary) hypertension: Secondary | ICD-10-CM

## 2022-05-19 LAB — COMPREHENSIVE METABOLIC PANEL
ALT: 17 IU/L (ref 0–32)
AST: 22 IU/L (ref 0–40)
Albumin/Globulin Ratio: 1.6 (ref 1.2–2.2)
Albumin: 4.1 g/dL (ref 3.9–4.9)
Alkaline Phosphatase: 115 IU/L (ref 44–121)
BUN/Creatinine Ratio: 19 (ref 12–28)
BUN: 15 mg/dL (ref 8–27)
Bilirubin Total: 0.6 mg/dL (ref 0.0–1.2)
CO2: 22 mmol/L (ref 20–29)
Calcium: 9.6 mg/dL (ref 8.7–10.3)
Chloride: 101 mmol/L (ref 96–106)
Creatinine, Ser: 0.79 mg/dL (ref 0.57–1.00)
Globulin, Total: 2.5 g/dL (ref 1.5–4.5)
Glucose: 112 mg/dL — ABNORMAL HIGH (ref 70–99)
Potassium: 4.5 mmol/L (ref 3.5–5.2)
Sodium: 140 mmol/L (ref 134–144)
Total Protein: 6.6 g/dL (ref 6.0–8.5)
eGFR: 83 mL/min/{1.73_m2} (ref >59)

## 2022-05-19 MED ORDER — LISINOPRIL-HYDROCHLOROTHIAZIDE 20-25 MG PO TABS: 1.0000 | ORAL_TABLET | Freq: Every day | ORAL | 1 refills | Status: DC

## 2022-05-19 NOTE — Progress Notes (Signed)
Patient came in today recheck for BP.  Provider made aware of BP.  Per Dr. Sedalia Muta: Patient BP is fine, ordered cmp to check kidney function continue Lisinopril/HCTZ daily and follow up with provider as schedule.  Patient made aware, verbalized understanding.

## 2022-06-15 ENCOUNTER — Other Ambulatory Visit: Payer: Self-pay | Admitting: Physician Assistant

## 2022-06-15 DIAGNOSIS — F419 Anxiety disorder, unspecified: Secondary | ICD-10-CM

## 2022-06-22 ENCOUNTER — Ambulatory Visit: Payer: Managed Care, Other (non HMO) | Admitting: Physician Assistant

## 2022-09-21 ENCOUNTER — Other Ambulatory Visit: Payer: Self-pay | Admitting: Family Medicine

## 2022-09-21 ENCOUNTER — Other Ambulatory Visit: Payer: Self-pay | Admitting: Physician Assistant

## 2022-09-21 DIAGNOSIS — F419 Anxiety disorder, unspecified: Secondary | ICD-10-CM

## 2022-10-11 ENCOUNTER — Ambulatory Visit: Payer: Managed Care, Other (non HMO) | Admitting: Physician Assistant

## 2022-10-11 ENCOUNTER — Encounter: Payer: Self-pay | Admitting: Physician Assistant

## 2022-10-11 VITALS — BP 138/72 | HR 68 | Temp 97.7°F | Resp 15 | Ht 62.0 in | Wt 281.2 lb

## 2022-10-11 DIAGNOSIS — Z23 Encounter for immunization: Secondary | ICD-10-CM | POA: Diagnosis not present

## 2022-10-11 DIAGNOSIS — Z1231 Encounter for screening mammogram for malignant neoplasm of breast: Secondary | ICD-10-CM

## 2022-10-11 DIAGNOSIS — E782 Mixed hyperlipidemia: Secondary | ICD-10-CM | POA: Diagnosis not present

## 2022-10-11 DIAGNOSIS — N958 Other specified menopausal and perimenopausal disorders: Secondary | ICD-10-CM

## 2022-10-11 DIAGNOSIS — I10 Essential (primary) hypertension: Secondary | ICD-10-CM

## 2022-10-11 DIAGNOSIS — E559 Vitamin D deficiency, unspecified: Secondary | ICD-10-CM | POA: Diagnosis not present

## 2022-10-11 DIAGNOSIS — F419 Anxiety disorder, unspecified: Secondary | ICD-10-CM

## 2022-10-11 DIAGNOSIS — J060 Acute laryngopharyngitis: Secondary | ICD-10-CM

## 2022-10-11 DIAGNOSIS — R739 Hyperglycemia, unspecified: Secondary | ICD-10-CM

## 2022-10-11 MED ORDER — AZITHROMYCIN 250 MG PO TABS
ORAL_TABLET | ORAL | 0 refills | Status: AC
Start: 2022-10-11 — End: 2022-10-16

## 2022-10-11 NOTE — Progress Notes (Signed)
Subjective:  Patient ID: Joanna [name], female    DOB: 05-Apr-1955  Age: 67 y.o. MRN: [id number]  Chief Complaint  Patient presents with   Medical Management of Chronic Issues       Hypertension  Hyperlipidemia    Pt presents for follow up of hypertension. The patient is tolerating the medication well without side effects. Compliance with treatment has been good; including taking medication as directed , maintains a healthy diet and regular exercise regimen , and following up as directed. Currently taking zestoretic 20/25 mg qd   Pt with history of vit D def - currently on supplement twice weekly - due for labwork  Pt with history of anxiety - she is doing well on lexapro 20mg  qd and xanax as needed  Pt with history of osteoarthritis - using lodine 200mg   Pt with history of insomnia - stable on trazodone 150mg  qd  Pt with history of hyperlipidemia - at last visit she was changed from pravachol to lipitor 40mg  qd  Pt complains of sinus pressure, pnd and congestion for the past few weeks - has taken otc meds  Pt would like to schedule dexa and mammogram She has been contacted by GI for colonoscopy - she will need to call and schedule Pt would like flu shot today Current Outpatient Medications on File Prior to Visit  Medication Sig Dispense Refill   ALPRAZolam (XANAX) 0.5 MG tablet TAKE ONE TABLET BY MOUTH AT BEDTIME AS NEEDED FOR ANXIETY 30 tablet 1   aspirin 81 MG EC tablet Take by mouth.     atorvastatin (LIPITOR) 40 MG tablet Take 1 tablet (40 mg total) by mouth daily. 90 tablet 1   cetirizine (ZYRTEC) 10 MG tablet Take 10 mg by mouth daily.     escitalopram (LEXAPRO) 20 MG tablet TAKE ONE TABLET BY MOUTH DAILY 90 tablet 0   etodolac (LODINE) 200 MG capsule Take 400 mg by mouth 2 (two) times daily.     lisinopril-hydrochlorothiazide (ZESTORETIC) 20-25 MG tablet Take 1 tablet by mouth daily. 90 tablet 1   traZODone (DESYREL) 150 MG tablet TAKE 1 TABLET BY MOUTH AT BEDTIME  90 tablet 1   Vitamin D, Ergocalciferol, (DRISDOL) 1.25 MG (50000 UNIT) CAPS capsule Take 1 capsule (50,000 Units total) by mouth 2 (two) times a week. 24 capsule 1   No current facility-administered medications on file prior to visit.   Past Medical History:  Diagnosis Date   Angina    "related to stress"   Arthritis    "fingers"   Cancer ([facility]) 07/2010   left breast   Difficult intubation    "short neck"   GERD (gastroesophageal reflux disease)    H/O hiatal hernia    Headache(784.0)    History of migraines 01/26/11   "it's been several years since my last one"   Hypercholesteremia    Hypertension    echo done 2012  [facility] in [place]   PONV (postoperative nausea and vomiting)    Recurrent upper respiratory infection (URI)    sinus infection-finishing up antibiotic 01/24/11   Sleep apnea ~ 2009   sleep study done ; [place]   Past Surgical History:  Procedure Laterality Date   ABDOMINAL HYSTERECTOMY  1999   APPENDECTOMY  1999   BREAST REDUCTION SURGERY  01/25/11   right breast "reduced & lifted"   BREAST REDUCTION SURGERY  01/25/2011   Procedure: BREAST REDUCTION WITH LIPOSUCTION;  Surgeon: Rossie Muskrat;  Location: [facility];  Service:  Plastics;  Laterality: Right;  Right Breast Reduction    BREAST SURGERY  08/2010   left mastectomy-patient still has drainage tube in   BREAST SURGERY  01/25/11   right reduction & lift   BUNIONECTOMY  ~ 2009   left   CARPAL TUNNEL RELEASE  1981-1984   bilaterally   CHOLECYSTECTOMY     HAMMER TOE SURGERY  ~ 2009   left   LATISSIMUS FLAP TO BREAST  12/13/2010   Procedure: LATISSIMUS FLAP TO BREAST;  Surgeon: Rossie Muskrat;  Location: [facility];  Service: Plastics;  Laterality: Left;  LEFT LATISSIMUS FLAP WITH IMPLANT    History reviewed. No pertinent family history. Social History   Socioeconomic History   Marital status: Married    Spouse name: Not on file   Number of children: Not on file   Years of education: Not on file    Highest education level: Not on file  Occupational History   Not on file  Tobacco Use   Smoking status: Never   Smokeless tobacco: Never  Substance and Sexual Activity   Alcohol use: Yes    Comment: occ.; haven't drank anything since 01/2010 (01/26/11)   Drug use: No   Sexual activity: Yes  Other Topics Concern   Not on file  Social History Narrative   Not on file   Social Determinants of Health   Financial Resource Strain: Not on file  Food Insecurity: Not on file  Transportation Needs: Not on file  Physical Activity: Not on file  Stress: Not on file  Social Connections: Unknown (06/10/2021)   Received from [facility], [facility]   Social Network    Social Network: Not on file   CONSTITUTIONAL: Negative for chills, fatigue, fever, unintentional weight gain and unintentional weight loss.  E/N/T: see HPI CARDIOVASCULAR: Negative for chest pain, dizziness, palpitations and pedal edema.  RESPIRATORY: Negative for recent cough and dyspnea.  GASTROINTESTINAL: Negative for abdominal pain, acid reflux symptoms, constipation, diarrhea, nausea and vomiting.  MSK: Negative for arthralgias and myalgias.  INTEGUMENTARY: Negative for rash.  NEUROLOGICAL: Negative for dizziness and headaches.  PSYCHIATRIC: Negative for sleep disturbance and to question depression screen.  Negative for depression, negative for anhedonia.       Objective:  PHYSICAL EXAM:   VS: BP 138/72 (BP Location: Left Arm, Patient Position: Sitting, Cuff Size: Large)   Pulse 68   Temp 97.7 F (36.5 C) (Temporal)   Resp 15   Ht 5\' 2"  (1.575 m)   Wt 281 lb 3.2 oz (127.6 kg)   SpO2 96%   BMI 51.43 kg/m   GEN: Well nourished, well developed, in no acute distress  HEENT: normal external ears and nose - normal external auditory canals and TMS -  - Lips, Teeth and Gums - normal  Oropharynx - erythema/pnd - sinus tenderness across face  Cardiac: RRR; no murmurs, rubs, or gallops,no edema - Respiratory:   normal respiratory rate and pattern with no distress - normal breath sounds with no rales, rhonchi, wheezes or rubs  Skin: warm and dry, no rash  Neuro:  Alert and Oriented x 3, - CN II-Xii grossly intact Psych: euthymic mood, appropriate affect and demeanor   Lab Results  Component Value Date   WBC 7.7 04/14/2022   HGB 14.0 04/14/2022   HCT 41.5 04/14/2022   PLT 280 04/14/2022   GLUCOSE 112 (H) 05/19/2022   CHOL 246 (H) 04/14/2022   TRIG 104 04/14/2022   HDL 51 04/14/2022  LDLCALC 177 (H) 04/14/2022   ALT 17 05/19/2022   AST 22 05/19/2022   NA 140 05/19/2022   K 4.5 05/19/2022   CL 101 05/19/2022   CREATININE 0.79 05/19/2022   BUN 15 05/19/2022   CO2 22 05/19/2022   TSH 1.180 04/14/2022      Assessment & Plan:   Problem List Items Addressed This Visit       Cardiovascular and Mediastinum   Benign hypertension - Primary - uncontrolled   Relevant Orders   CBC with Differential/Platelet   Comprehensive metabolic panel   TSH Continue current meds     Other   Anxiety Take meds as directed    Mixed hyperlipidemia   Relevant Orders   Lipid panel Watch diet Continue current meds   Other Visit Diagnoses     Vitamin D insufficiency       Relevant Orders   VITAMIN D 25 Hydroxy (Vit-D Deficiency, Fractures)  Hyperglycemia with last labs Hgb A1c pending Watch diet  Screening breast cancer Mammogram ordered  Uri Continue mucinex Rx zpack  Need for flu shot Fluad trivalent given                    .  Meds ordered this encounter  Medications   azithromycin (ZITHROMAX) 250 MG tablet    Sig: Take 2 tablets on day 1, then 1 tablet daily on days 2 through 5    Dispense:  6 tablet    Refill:  0    Order Specific Question:   Supervising Provider    AnswerCorey Harold    Orders Placed This Encounter  Procedures   MM 3D SCREENING MAMMOGRAM BILATERAL BREAST   HM DEXA SCAN   Flu Vaccine Trivalent High Dose (Fluad)   CBC with  Differential/Platelet   Comprehensive metabolic panel   TSH   Lipid panel   VITAMIN D 25 Hydroxy (Vit-D Deficiency, Fractures)   Hemoglobin A1c     Follow-up: No follow-ups on file.  An After Visit Summary was printed and given to the patient.  Jettie Pagan [facility] [phone]

## 2022-10-12 ENCOUNTER — Other Ambulatory Visit: Payer: Managed Care, Other (non HMO)

## 2022-10-13 LAB — CBC WITH DIFFERENTIAL/PLATELET
Basophils Absolute: 0.1 10*3/uL (ref 0.0–0.2)
Basos: 1 %
EOS (ABSOLUTE): 0.4 10*3/uL (ref 0.0–0.4)
Eos: 5 %
Hematocrit: 40.3 % (ref 34.0–46.6)
Hemoglobin: 13.4 g/dL (ref 11.1–15.9)
Immature Grans (Abs): 0 10*3/uL (ref 0.0–0.1)
Immature Granulocytes: 0 %
Lymphocytes Absolute: 2.1 10*3/uL (ref 0.7–3.1)
Lymphs: 25 %
MCH: 29.7 pg (ref 26.6–33.0)
MCHC: 33.3 g/dL (ref 31.5–35.7)
MCV: 89 fL (ref 79–97)
Monocytes Absolute: 0.6 10*3/uL (ref 0.1–0.9)
Monocytes: 7 %
Neutrophils Absolute: 5.2 10*3/uL (ref 1.4–7.0)
Neutrophils: 62 %
Platelets: 299 10*3/uL (ref 150–450)
RBC: 4.51 x10E6/uL (ref 3.77–5.28)
RDW: 12.8 % (ref 11.7–15.4)
WBC: 8.3 10*3/uL (ref 3.4–10.8)

## 2022-10-13 LAB — COMPREHENSIVE METABOLIC PANEL
ALT: 19 IU/L (ref 0–32)
AST: 17 IU/L (ref 0–40)
Albumin: 4.4 g/dL (ref 3.9–4.9)
Alkaline Phosphatase: 116 IU/L (ref 44–121)
BUN/Creatinine Ratio: 23 (ref 12–28)
BUN: 18 mg/dL (ref 8–27)
Bilirubin Total: 0.6 mg/dL (ref 0.0–1.2)
CO2: 21 mmol/L (ref 20–29)
Calcium: 10 mg/dL (ref 8.7–10.3)
Chloride: 97 mmol/L (ref 96–106)
Creatinine, Ser: 0.78 mg/dL (ref 0.57–1.00)
Globulin, Total: 2.6 g/dL (ref 1.5–4.5)
Glucose: 99 mg/dL (ref 70–99)
Potassium: 4.5 mmol/L (ref 3.5–5.2)
Sodium: 135 mmol/L (ref 134–144)
Total Protein: 7 g/dL (ref 6.0–8.5)
eGFR: 84 mL/min/{1.73_m2} (ref >59)

## 2022-10-13 LAB — LIPID PANEL
Chol/HDL Ratio: 4.5 ratio — ABNORMAL HIGH (ref 0.0–4.4)
Cholesterol, Total: 217 mg/dL — ABNORMAL HIGH (ref 100–199)
HDL: 48 mg/dL (ref >39)
LDL Chol Calc (NIH): 147 mg/dL — ABNORMAL HIGH (ref 0–99)
Triglycerides: 122 mg/dL (ref 0–149)
VLDL Cholesterol Cal: 22 mg/dL (ref 5–40)

## 2022-10-13 LAB — HEMOGLOBIN A1C
Est. average glucose Bld gHb Est-mCnc: 108 mg/dL
Hgb A1c MFr Bld: 5.4 % (ref 4.8–5.6)

## 2022-10-13 LAB — TSH: TSH: 1.15 u[IU]/mL (ref 0.450–4.500)

## 2022-10-13 LAB — VITAMIN D 25 HYDROXY (VIT D DEFICIENCY, FRACTURES): Vit D, 25-Hydroxy: 54.6 ng/mL (ref 30.0–100.0)

## 2022-10-16 ENCOUNTER — Telehealth: Payer: Self-pay | Admitting: Physician Assistant

## 2022-10-16 NOTE — Telephone Encounter (Signed)
   Joanna [name] has been scheduled for the following appointment:  WHAT: MAMMOGRAM AN BONE DENSITY WHERE: [facility] DATE: 11/08/22 TIME: 2:00 PM CHECK-IN  A message has been left for the patient.

## 2022-10-18 ENCOUNTER — Other Ambulatory Visit: Payer: Self-pay

## 2022-10-18 DIAGNOSIS — Z1231 Encounter for screening mammogram for malignant neoplasm of breast: Secondary | ICD-10-CM

## 2022-10-18 DIAGNOSIS — N958 Other specified menopausal and perimenopausal disorders: Secondary | ICD-10-CM

## 2022-10-26 ENCOUNTER — Other Ambulatory Visit: Payer: Self-pay | Admitting: Physician Assistant

## 2022-11-08 LAB — HM DEXA SCAN: HM Dexa Scan: NORMAL

## 2022-11-08 LAB — HM MAMMOGRAPHY

## 2022-11-09 ENCOUNTER — Encounter: Payer: Self-pay | Admitting: Physician Assistant

## 2022-11-13 ENCOUNTER — Encounter: Payer: Self-pay | Admitting: Physician Assistant

## 2022-12-15 ENCOUNTER — Other Ambulatory Visit: Payer: Self-pay

## 2022-12-15 DIAGNOSIS — E559 Vitamin D deficiency, unspecified: Secondary | ICD-10-CM

## 2022-12-15 DIAGNOSIS — F419 Anxiety disorder, unspecified: Secondary | ICD-10-CM

## 2022-12-15 MED ORDER — ALPRAZOLAM 0.5 MG PO TABS: 0.5000 mg | ORAL_TABLET | Freq: Every day | ORAL | 1 refills | Status: DC | PRN

## 2022-12-15 MED ORDER — VITAMIN D (ERGOCALCIFEROL) 1.25 MG (50000 UNIT) PO CAPS
50000.0000 [IU] | ORAL_CAPSULE | ORAL | 1 refills | Status: DC
Start: 2022-12-18 — End: 2023-10-18

## 2022-12-15 NOTE — Telephone Encounter (Signed)
Copied from CRM [id number]. Topic: Clinical - Medication Refill >> Dec 15, 2022  8:25 AM Dollene Primrose wrote: Most Recent Primary Care Visit:  Provider: [facility]  Department: [facility]  Visit Type: LAB  Date: 10/12/2022  Medication:1- ALPRAZolam (XANAX) 0.5 MG tablet  2-Vitamin D, Ergocalciferol, (DRISDOL) 1.25 MG (50000 UNIT) CAPS capsule   Has the patient contacted their pharmacy? Yes-[facility] (Agent: If no, request that the patient contact the pharmacy for the refill. If patient does not wish to contact the pharmacy document the reason why and proceed with request.) (Agent: If yes, when and what did the pharmacy advise?)  Is this the correct pharmacy for this prescription? yes If no, delete pharmacy and type the correct one.  This is the patient's preferred pharmacy:  [id number] [place] [place] - [address] [place] [address] Phone: [phone] Fax: [phone]   Has the prescription been filled recently? yes  Is the patient out of the medication? yes  Has the patient been seen for an appointment in the last year OR does the patient have an upcoming appointment? yes  Can we respond through MyChart? yes  Agent: Please be advised that Rx refills may take up to 3 business days. We ask that you follow-up with your pharmacy.

## 2023-02-03 ENCOUNTER — Other Ambulatory Visit: Payer: Self-pay | Admitting: Physician Assistant

## 2023-03-05 ENCOUNTER — Other Ambulatory Visit: Payer: Self-pay | Admitting: Family Medicine

## 2023-03-05 DIAGNOSIS — I10 Essential (primary) hypertension: Secondary | ICD-10-CM

## 2023-03-24 ENCOUNTER — Other Ambulatory Visit: Payer: Self-pay | Admitting: Physician Assistant

## 2023-03-24 DIAGNOSIS — F419 Anxiety disorder, unspecified: Secondary | ICD-10-CM

## 2023-03-26 ENCOUNTER — Other Ambulatory Visit: Payer: Self-pay | Admitting: Physician Assistant

## 2023-03-26 MED ORDER — OSELTAMIVIR PHOSPHATE 75 MG PO CAPS: 75.0000 mg | ORAL_CAPSULE | Freq: Every day | ORAL | 0 refills | Status: DC

## 2023-04-11 ENCOUNTER — Ambulatory Visit: Payer: Managed Care, Other (non HMO) | Admitting: Physician Assistant

## 2023-04-16 ENCOUNTER — Ambulatory Visit: Payer: Managed Care, Other (non HMO) | Admitting: Physician Assistant

## 2023-04-16 ENCOUNTER — Encounter: Payer: Self-pay | Admitting: Physician Assistant

## 2023-04-16 VITALS — BP 136/80 | HR 66 | Temp 98.2°F | Resp 18 | Ht 62.0 in | Wt 281.4 lb

## 2023-04-16 DIAGNOSIS — E782 Mixed hyperlipidemia: Secondary | ICD-10-CM | POA: Diagnosis not present

## 2023-04-16 DIAGNOSIS — E559 Vitamin D deficiency, unspecified: Secondary | ICD-10-CM | POA: Diagnosis not present

## 2023-04-16 DIAGNOSIS — F419 Anxiety disorder, unspecified: Secondary | ICD-10-CM

## 2023-04-16 DIAGNOSIS — I10 Essential (primary) hypertension: Secondary | ICD-10-CM | POA: Diagnosis not present

## 2023-04-16 DIAGNOSIS — R739 Hyperglycemia, unspecified: Secondary | ICD-10-CM

## 2023-04-16 DIAGNOSIS — Z1211 Encounter for screening for malignant neoplasm of colon: Secondary | ICD-10-CM

## 2023-04-16 MED ORDER — ATORVASTATIN CALCIUM 40 MG PO TABS: 40.0000 mg | ORAL_TABLET | Freq: Every day | ORAL | 1 refills | Status: DC

## 2023-04-16 NOTE — Progress Notes (Signed)
 Subjective:  Patient ID: Joanna [name], female    DOB: [birth year]  Age: 68 y.o. MRN: [id number]  Chief Complaint  Patient presents with   Medical Management of Chronic Issues       Hypertension  Hyperlipidemia    Pt presents for follow up of hypertension. The patient is tolerating the medication well without side effects. Compliance with treatment has been good; including taking medication as directed , maintains a healthy diet and regular exercise regimen , and following up as directed. Currently taking zestoretic 20/25 mg qd  BP today 136/80 She denies chest pain or shortness of breath  Pt with history of vit D def - currently on supplement twice weekly - due for labwork  Pt with history of anxiety - she is doing well on lexapro 20mg  qd and xanax as needed  Pt with history of osteoarthritis - using lodine 200mg   Pt with history of insomnia - stable on trazodone 150mg  qd  Pt with history of hyperlipidemia - she had been on lipitor 40mg  but stopped thinking it may had been causing leg cramps (but had been on this medication for awhile and done well) - she would like to try med again now that she has a new job walking around and moving about (thinks that has actually helped with her leg cramps)  Current Outpatient Medications on File Prior to Visit  Medication Sig Dispense Refill   ALPRAZolam (XANAX) 0.5 MG tablet TAKE 1 TABLET BY MOUTH EVERY DAY AS NEEDED FOR ANXIETY 30 tablet 1   aspirin 81 MG EC tablet Take by mouth.     cetirizine (ZYRTEC) 10 MG tablet Take 10 mg by mouth daily.     escitalopram (LEXAPRO) 20 MG tablet TAKE ONE BY MOUTH DAILY 60 tablet 2   etodolac (LODINE) 200 MG capsule Take 400 mg by mouth 2 (two) times daily.     lisinopril-hydrochlorothiazide (ZESTORETIC) 20-25 MG tablet TAKE ONE TABLET BY MOUTH DAILY 90 tablet 1   traZODone (DESYREL) 150 MG tablet TAKE 1 TABLET BY MOUTH AT BEDTIME 90 tablet 1   Vitamin D, Ergocalciferol, (DRISDOL) 1.25 MG (50000  UNIT) CAPS capsule Take 1 capsule (50,000 Units total) by mouth 2 (two) times a week. 24 capsule 1   No current facility-administered medications on file prior to visit.   Past Medical History:  Diagnosis Date   Angina    "related to stress"   Arthritis    "fingers"   Cancer (HCC) 07/2010   left breast   Difficult intubation    "short neck"   GERD (gastroesophageal reflux disease)    H/O hiatal hernia    Headache(784.0)    History of migraines 01/26/11   "it's been several years since my last one"   Hypercholesteremia    Hypertension    echo done 2012  [facility] in [place]   PONV (postoperative nausea and vomiting)    Recurrent upper respiratory infection (URI)    sinus infection-finishing up antibiotic 01/24/11   Sleep apnea ~ 2009   sleep study done ; [place]   Past Surgical History:  Procedure Laterality Date   ABDOMINAL HYSTERECTOMY  1999   APPENDECTOMY  1999   BREAST REDUCTION SURGERY  01/25/11   right breast "reduced & lifted"   BREAST REDUCTION SURGERY  01/25/2011   Procedure: BREAST REDUCTION WITH LIPOSUCTION;  Surgeon: Rossie Muskrat;  [facility]: [facility];  Service: Plastics;  Laterality: Right;  Right Breast Reduction    BREAST SURGERY  08/2010   left mastectomy-patient still has drainage tube in   BREAST SURGERY  01/25/11   right reduction & lift   BUNIONECTOMY  ~ 2009   left   CARPAL TUNNEL RELEASE  1981-1984   bilaterally   CHOLECYSTECTOMY     HAMMER TOE SURGERY  ~ 2009   left   LATISSIMUS FLAP TO BREAST  12/13/2010   Procedure: LATISSIMUS FLAP TO BREAST;  Surgeon: Rossie Muskrat;  Location: [facility];  Service: Plastics;  Laterality: Left;  LEFT LATISSIMUS FLAP WITH IMPLANT    History reviewed. No pertinent family history. Social History   Socioeconomic History   Marital status: Married    Spouse name: Not on file   Number of children: Not on file   Years of education: Not on file   Highest education level: Not on file  Occupational History    Not on file  Tobacco Use   Smoking status: Never   Smokeless tobacco: Never  Substance and Sexual Activity   Alcohol use: Yes    Comment: occ.; haven't drank anything since 01/2010 (01/26/11)   Drug use: No   Sexual activity: Yes  Other Topics Concern   Not on file  Social History Narrative   Not on file   Social Drivers of Health   Financial Resource Strain: Low Risk  (04/16/2023)   Overall Financial Resource Strain (CARDIA)    Difficulty of Paying Living Expenses: Not hard at all  Food Insecurity: No Food Insecurity (04/16/2023)   Hunger Vital Sign    Worried About Running Out of Food in the Last Year: Never true    Ran Out of Food in the Last Year: Never true  Transportation Needs: No Transportation Needs (04/16/2023)   PRAPARE - Administrator, Civil Service (Medical): No    Lack of Transportation (Non-Medical): No  Physical Activity: Insufficiently Active (04/16/2023)   Exercise Vital Sign    Days of Exercise per Week: 2 days    Minutes of Exercise per Session: 30 min  Stress: No Stress Concern Present (04/16/2023)   [employer] of Occupational Health - Occupational Stress Questionnaire    Feeling of Stress : Not at all  Social Connections: Unknown (04/16/2023)   Social Connection and Isolation Panel [NHANES]    Frequency of Communication with Friends and Family: More than three times a week    Frequency of Social Gatherings with Friends and Family: More than three times a week    Attends Religious Services: Not on Marketing executive or Organizations: No    Attends Banker Meetings: Never    Marital Status: Married   CONSTITUTIONAL: Negative for chills, fatigue, fever, unintentional weight gain and unintentional weight loss.  E/N/T: Negative for ear pain, nasal congestion and sore throat.  CARDIOVASCULAR: Negative for chest pain, dizziness, palpitations and pedal edema.  RESPIRATORY: Negative for recent cough and dyspnea.   GASTROINTESTINAL: Negative for abdominal pain, acid reflux symptoms, constipation, diarrhea, nausea and vomiting.  MSK: Negative for arthralgias and myalgias.  INTEGUMENTARY: Negative for rash.  NEUROLOGICAL: Negative for dizziness and headaches.  PSYCHIATRIC: Negative for sleep disturbance and to question depression screen.  Negative for depression, negative for anhedonia.       Objective:  PHYSICAL EXAM:   VS: BP 136/80 (BP Location: Left Arm, Patient Position: Sitting, Cuff Size: Large)   Pulse 66   Temp 98.2 F (36.8 C) (Temporal)   Resp 18   Ht 5\' 2"  (  1.575 m)   Wt 281 lb 6.4 oz (127.6 kg)   SpO2 95%   BMI 51.47 kg/m   GEN: Well nourished, well developed, in no acute distress   Cardiac: RRR; no murmurs, rubs, or gallops,no edema -  Respiratory:  normal respiratory rate and pattern with no distress - normal breath sounds with no rales, rhonchi, wheezes or rubs  MS: no deformity or atrophy  Skin: warm and dry, no rash  Neuro:  Alert and Oriented x 3,  - CN II-Xii grossly intact Psych: euthymic mood, appropriate affect and demeanor  Lab Results  Component Value Date   WBC 8.3 10/12/2022   HGB 13.4 10/12/2022   HCT 40.3 10/12/2022   PLT 299 10/12/2022   GLUCOSE 99 10/12/2022   CHOL 217 (H) 10/12/2022   TRIG 122 10/12/2022   HDL 48 10/12/2022   LDLCALC 147 (H) 10/12/2022   ALT 19 10/12/2022   AST 17 10/12/2022   NA 135 10/12/2022   K 4.5 10/12/2022   CL 97 10/12/2022   CREATININE 0.78 10/12/2022   BUN 18 10/12/2022   CO2 21 10/12/2022   TSH 1.150 10/12/2022   HGBA1C 5.4 10/12/2022      Assessment & Plan:   Problem List Items Addressed This Visit       Cardiovascular and Mediastinum   Benign hypertension - Primary -    Relevant Orders   CBC with Differential/Platelet   Comprehensive metabolic panel   TSH Continue current meds     Other   Anxiety Take meds as directed    Mixed hyperlipidemia   Relevant Orders   Lipid panel Watch  diet Restart lipitor 40mg  qd   Other Visit Diagnoses     Vitamin D insufficiency       Relevant Orders   VITAMIN D 25 Hydroxy (Vit-D Deficiency, Fractures)  Hyperglycemia Hgb A1c pending Watch diet  Colon cancer screening Referral to GI - Dr Jennye Boroughs                    .  Meds ordered this encounter  Medications   atorvastatin (LIPITOR) 40 MG tablet    Sig: Take 1 tablet (40 mg total) by mouth daily.    Dispense:  90 tablet    Refill:  1    Supervising Provider:   Blane Ohara [phone]    Orders Placed This Encounter  Procedures   CBC with Differential/Platelet   Comprehensive metabolic panel   Lipid panel   Hemoglobin A1c   VITAMIN D 25 Hydroxy (Vit-D Deficiency, Fractures)   TSH   Ambulatory referral to Gastroenterology     Follow-up: Return in about 6 months (around 10/17/2023) for fasting physical - 20 min.  An After Visit Summary was printed and given to the patient.  Jettie Pagan [facility] [phone]

## 2023-04-17 LAB — COMPREHENSIVE METABOLIC PANEL
ALT: 17 IU/L (ref 0–32)
AST: 18 IU/L (ref 0–40)
Albumin: 4.4 g/dL (ref 3.9–4.9)
Alkaline Phosphatase: 90 IU/L (ref 44–121)
BUN/Creatinine Ratio: 33 — ABNORMAL HIGH (ref 12–28)
BUN: 24 mg/dL (ref 8–27)
Bilirubin Total: 0.5 mg/dL (ref 0.0–1.2)
CO2: 25 mmol/L (ref 20–29)
Calcium: 9.6 mg/dL (ref 8.7–10.3)
Chloride: 99 mmol/L (ref 96–106)
Creatinine, Ser: 0.72 mg/dL (ref 0.57–1.00)
Globulin, Total: 2.4 g/dL (ref 1.5–4.5)
Glucose: 80 mg/dL (ref 70–99)
Potassium: 3.8 mmol/L (ref 3.5–5.2)
Sodium: 141 mmol/L (ref 134–144)
Total Protein: 6.8 g/dL (ref 6.0–8.5)
eGFR: 92 mL/min/{1.73_m2} (ref >59)

## 2023-04-17 LAB — CBC WITH DIFFERENTIAL/PLATELET
Basophils Absolute: 0.1 10*3/uL (ref 0.0–0.2)
Basos: 1 %
EOS (ABSOLUTE): 0.4 10*3/uL (ref 0.0–0.4)
Eos: 5 %
Hematocrit: 38.6 % (ref 34.0–46.6)
Hemoglobin: 12.8 g/dL (ref 11.1–15.9)
Immature Grans (Abs): 0 10*3/uL (ref 0.0–0.1)
Immature Granulocytes: 0 %
Lymphocytes Absolute: 1.8 10*3/uL (ref 0.7–3.1)
Lymphs: 26 %
MCH: 29.9 pg (ref 26.6–33.0)
MCHC: 33.2 g/dL (ref 31.5–35.7)
MCV: 90 fL (ref 79–97)
Monocytes Absolute: 0.5 10*3/uL (ref 0.1–0.9)
Monocytes: 7 %
Neutrophils Absolute: 4.2 10*3/uL (ref 1.4–7.0)
Neutrophils: 61 %
Platelets: 274 10*3/uL (ref 150–450)
RBC: 4.28 x10E6/uL (ref 3.77–5.28)
RDW: 13.1 % (ref 11.7–15.4)
WBC: 6.9 10*3/uL (ref 3.4–10.8)

## 2023-04-17 LAB — LIPID PANEL
Chol/HDL Ratio: 4.8 ratio — ABNORMAL HIGH (ref 0.0–4.4)
Cholesterol, Total: 297 mg/dL — ABNORMAL HIGH (ref 100–199)
HDL: 62 mg/dL (ref >39)
LDL Chol Calc (NIH): 215 mg/dL — ABNORMAL HIGH (ref 0–99)
Triglycerides: 112 mg/dL (ref 0–149)
VLDL Cholesterol Cal: 20 mg/dL (ref 5–40)

## 2023-04-17 LAB — HEMOGLOBIN A1C
Est. average glucose Bld gHb Est-mCnc: 108 mg/dL
Hgb A1c MFr Bld: 5.4 % (ref 4.8–5.6)

## 2023-04-17 LAB — VITAMIN D 25 HYDROXY (VIT D DEFICIENCY, FRACTURES): Vit D, 25-Hydroxy: 31.7 ng/mL (ref 30.0–100.0)

## 2023-04-17 LAB — TSH: TSH: 0.705 u[IU]/mL (ref 0.450–4.500)

## 2023-05-31 ENCOUNTER — Other Ambulatory Visit: Payer: Self-pay | Admitting: Physician Assistant

## 2023-05-31 DIAGNOSIS — F419 Anxiety disorder, unspecified: Secondary | ICD-10-CM

## 2023-05-31 NOTE — Telephone Encounter (Signed)
 Copied from CRM [id number]. Topic: Clinical - Medication Refill >> May 31, 2023  1:16 PM Rosamond Comes wrote: Patient calling requesting medication refill  Most Recent Primary Care Visit:  Provider: Cyndi Drain  Department: [facility]  Visit Type: OFFICE VISIT  Date: 04/16/2023  Medication: ALPRAZolam  (XANAX ) 0.5 MG tablet   Has the patient contacted their pharmacy? No  patient changing back to previous pharmacy  Is this the correct pharmacy for this prescription? Yes  [facility] [address] [place] [address] Phone # [phone]  Has the prescription been filled recently? Yes  Is the patient out of the medication? No patient has 3 days, 3 pills  Has the patient been seen for an appointment in the last year OR does the patient have an upcoming appointment? Yes  Can we respond through MyChart? Yes  Agent: Please be advised that Rx refills may take up to 3 business days. We ask that you follow-up with your pharmacy.  Patient phone number [phone]  ok to leave detailed message

## 2023-06-01 MED ORDER — ALPRAZOLAM 0.5 MG PO TABS: 0.5000 mg | ORAL_TABLET | Freq: Every day | ORAL | 1 refills | Status: DC | PRN

## 2023-06-04 ENCOUNTER — Telehealth: Payer: Self-pay

## 2023-06-04 NOTE — Telephone Encounter (Signed)
 Copied from CRM [id number]. Topic: Appointments - Scheduling Inquiry for Clinic >> May 31, 2023  1:24 PM Rosamond Comes wrote: Reason for CRM: patient calling in. Medicare part B started today and would like to schedule a physical for Medicare with in th first 60 days of coverage and other questions.  Please call patient back [phone] ok to leaved detailed message

## 2023-06-04 NOTE — Telephone Encounter (Signed)
 Appointment has been scheduled.

## 2023-06-11 ENCOUNTER — Encounter (HOSPITAL_COMMUNITY): Payer: Self-pay

## 2023-06-18 ENCOUNTER — Encounter: Payer: Self-pay | Admitting: Physician Assistant

## 2023-06-18 ENCOUNTER — Ambulatory Visit (INDEPENDENT_AMBULATORY_CARE_PROVIDER_SITE_OTHER): Admitting: Physician Assistant

## 2023-06-18 VITALS — BP 110/70 | HR 82 | Temp 98.2°F | Resp 16 | Ht 59.5 in | Wt 279.8 lb

## 2023-06-18 DIAGNOSIS — Z0000 Encounter for general adult medical examination without abnormal findings: Secondary | ICD-10-CM

## 2023-06-18 MED ORDER — ETODOLAC 200 MG PO CAPS: 200.0000 mg | ORAL_CAPSULE | Freq: Two times a day (BID) | ORAL | 1 refills | Status: DC

## 2023-06-18 NOTE — Progress Notes (Signed)
 Subjective:    Joanna [name] is a 68 y.o. female who presents for a Welcome to Medicare exam.   Cardiac Risk Factors include: advanced age (>71men, >64 women);dyslipidemia      Objective:    Today's Vitals   06/18/23 1406 06/18/23 1407  BP: 110/70   Pulse: 82   Resp: 16   Temp: 98.2 F (36.8 C)   TempSrc: Temporal   SpO2: 96%   Weight: 279 lb 12.8 oz (126.9 kg)   Height: 4' 11.5" (1.511 m)   PainSc: 0-No pain 0-No pain  Body mass index is 55.57 kg/m.  Medications Outpatient Encounter Medications as of 06/18/2023  Medication Sig   ALPRAZolam  (XANAX ) 0.5 MG tablet Take 1 tablet (0.5 mg total) by mouth daily as needed for anxiety.   aspirin 81 MG EC tablet Take by mouth.   atorvastatin  (LIPITOR) 40 MG tablet Take 1 tablet (40 mg total) by mouth daily.   cetirizine (ZYRTEC) 10 MG tablet Take 10 mg by mouth daily.   escitalopram  (LEXAPRO ) 20 MG tablet TAKE ONE BY MOUTH DAILY   lisinopril -hydrochlorothiazide  (ZESTORETIC ) 20-25 MG tablet TAKE ONE TABLET BY MOUTH DAILY   traZODone  (DESYREL ) 150 MG tablet TAKE 1 TABLET BY MOUTH AT BEDTIME   Vitamin D , Ergocalciferol , (DRISDOL ) 1.25 MG (50000 UNIT) CAPS capsule Take 1 capsule (50,000 Units total) by mouth 2 (two) times a week.   [DISCONTINUED] etodolac  (LODINE ) 200 MG capsule Take 400 mg by mouth 2 (two) times daily.   etodolac  (LODINE ) 200 MG capsule Take 1 capsule (200 mg total) by mouth 2 (two) times daily.   No facility-administered encounter medications on file as of 06/18/2023.     History: Past Medical History:  Diagnosis Date   Angina    "related to stress"   Arthritis    "fingers"   Cancer (HCC) 07/2010   left breast   Difficult intubation    "short neck"   GERD (gastroesophageal reflux disease)    H/O hiatal hernia    Headache(784.0)    History of migraines 01/26/11   "it's been several years since my last one"   Hypercholesteremia    Hypertension    echo done 2012  [facility] in [place]   PONV  (postoperative nausea and vomiting)    Recurrent upper respiratory infection (URI)    sinus infection-finishing up antibiotic 01/24/11   Sleep apnea ~ 2009   sleep study done ; [place]   Past Surgical History:  Procedure Laterality Date   ABDOMINAL HYSTERECTOMY  1999   APPENDECTOMY  1999   BREAST REDUCTION SURGERY  01/25/11   right breast "reduced & lifted"   BREAST REDUCTION SURGERY  01/25/2011   Procedure: BREAST REDUCTION WITH LIPOSUCTION;  Surgeon: Villa Greaser;  Location: [facility];  Service: Plastics;  Laterality: Right;  Right Breast Reduction    BREAST SURGERY  08/2010   left mastectomy-patient still has drainage tube in   BREAST SURGERY  01/25/11   right reduction & lift   BUNIONECTOMY  ~ 2009   left   CARPAL TUNNEL RELEASE  1981-1984   bilaterally   CHOLECYSTECTOMY     HAMMER TOE SURGERY  ~ 2009   left   LATISSIMUS FLAP TO BREAST  12/13/2010   Procedure: LATISSIMUS FLAP TO BREAST;  Surgeon: Villa Greaser;  Location: [facility];  Service: Plastics;  Laterality: Left;  LEFT LATISSIMUS FLAP WITH IMPLANT    History reviewed. No pertinent family history. Social History   Occupational  History   Not on file  Tobacco Use   Smoking status: Never   Smokeless tobacco: Never  Substance and Sexual Activity   Alcohol use: Yes    Comment: occ.; haven't drank anything since 01/2010 (01/26/11)   Drug use: No   Sexual activity: Yes    Tobacco Counseling Counseling given: Not Answered   Immunizations and Health Maintenance Immunization History  Administered Date(s) Administered   Fluad Quad(high Dose 65+) 12/16/2021   Fluad Trivalent(High Dose 65+) 10/11/2022   Influenza Inj Mdck Quad Pf 03/12/2020, 03/14/2021   Influenza-Unspecified 10/14/2018   PFIZER(Purple Top)SARS-COV-2 Vaccination 05/10/2019, 06/03/2019   PNEUMOCOCCAL CONJUGATE-20 12/16/2021   Tdap 07/30/2017   Zoster Recombinant(Shingrix) 09/09/2020, 11/04/2020   There are no preventive care reminders to display  for this patient.   Activities of Daily Living    06/18/2023    2:08 PM  In your present state of health, do you have any difficulty performing the following activities:  Hearing? 0  Vision? 0  Difficulty concentrating or making decisions? 0  Walking or climbing stairs? 0  Dressing or bathing? 0  Doing errands, shopping? 0  Preparing Food and eating ? N  Using the Toilet? N  In the past six months, have you accidently leaked urine? Y  Comment WHen coughing  Do you have problems with loss of bowel control? N  Managing your Medications? N  Managing your Finances? N  Housekeeping or managing your Housekeeping? N    Physical Exam   Physical Exam (optional), or other factors deemed appropriate based on the beneficiary's medical and social history and current clinical standards.  PHYSICAL EXAM:   VS: BP 110/70   Pulse 82   Temp 98.2 F (36.8 C) (Temporal)   Resp 16   Ht 4' 11.5" (1.511 m)   Wt 279 lb 12.8 oz (126.9 kg)   SpO2 96%   BMI 55.57 kg/m   GEN: Well nourished, well developed, in no acute distress   Cardiac: RRR; no murmurs, rubs, or gallops,no edema - Respiratory:  normal respiratory rate and pattern with no distress - normal breath sounds with no rales, rhonchi, wheezes or rubs  Skin: warm and dry, no rash  Neuro:  Alert and Oriented x 3,  - CN II-Xii grossly intact Psych: euthymic mood, appropriate affect and demeanor  Advanced Directives: Does Patient Have a Medical Advance Directive?: No Would patient like information on creating a medical advance directive?: No - Patient declined  EKG:  normal EKG, normal sinus rhythm, unchanged from previous tracings, sinus bradycardia      Assessment:    This is a routine wellness examination for this patient .       Goals   None    Depression Screen    06/18/2023    2:08 PM 04/16/2023   10:20 AM 10/11/2022    9:02 AM 12/16/2021   10:36 AM  PHQ 2/9 Scores  PHQ - 2 Score 0 0 0 0  PHQ- 9 Score  0 1       Fall Risk    06/18/2023    2:08 PM  Fall Risk   Falls in the past year? 0  Number falls in past yr: 0  Injury with Fall? 0  Risk for fall due to : No Fall Risks  Follow up Falls evaluation completed    Cognitive Function:        06/18/2023    2:09 PM  6CIT Screen  What Year? 0 points  What month?  0 points  What time? 0 points  Count back from 20 0 points  Months in reverse 0 points  Repeat phrase 0 points  Total Score 0 points    Patient Care Team: Paullette Boston as PCP - General (Physician Assistant)     Plan:      I have personally reviewed and noted the following in the patient's chart:   Medical and social history Use of alcohol, tobacco or illicit drugs  Current medications and supplements including opioid prescriptions. Patient is not currently taking opioid prescriptions. Functional ability and status Nutritional status Physical activity Advanced directives List of other physicians Hospitalizations, surgeries, and ER visits in previous 12 months Vitals Screenings to include cognitive, depression, and falls Referrals and appointments  In addition, I have reviewed and discussed with patient certain preventive protocols, quality metrics, and best practice recommendations. A written personalized care plan for preventive services as well as general preventive health recommendations were provided to patient.     SARA R Jovana Rembold, PA-C 06/18/2023

## 2023-08-13 ENCOUNTER — Other Ambulatory Visit: Payer: Self-pay | Admitting: Physician Assistant

## 2023-08-13 DIAGNOSIS — F419 Anxiety disorder, unspecified: Secondary | ICD-10-CM

## 2023-08-31 ENCOUNTER — Other Ambulatory Visit: Payer: Self-pay | Admitting: Physician Assistant

## 2023-09-17 ENCOUNTER — Other Ambulatory Visit: Payer: Self-pay | Admitting: Physician Assistant

## 2023-10-18 ENCOUNTER — Ambulatory Visit (INDEPENDENT_AMBULATORY_CARE_PROVIDER_SITE_OTHER): Admitting: Physician Assistant

## 2023-10-18 ENCOUNTER — Encounter: Payer: Self-pay | Admitting: Physician Assistant

## 2023-10-18 VITALS — BP 124/70 | HR 75 | Temp 97.8°F | Resp 18 | Ht 60.5 in | Wt 275.8 lb

## 2023-10-18 DIAGNOSIS — F419 Anxiety disorder, unspecified: Secondary | ICD-10-CM | POA: Diagnosis not present

## 2023-10-18 DIAGNOSIS — E782 Mixed hyperlipidemia: Secondary | ICD-10-CM

## 2023-10-18 DIAGNOSIS — R739 Hyperglycemia, unspecified: Secondary | ICD-10-CM

## 2023-10-18 DIAGNOSIS — I10 Essential (primary) hypertension: Secondary | ICD-10-CM | POA: Diagnosis not present

## 2023-10-18 DIAGNOSIS — E559 Vitamin D deficiency, unspecified: Secondary | ICD-10-CM | POA: Diagnosis not present

## 2023-10-18 DIAGNOSIS — Z853 Personal history of malignant neoplasm of breast: Secondary | ICD-10-CM

## 2023-10-18 DIAGNOSIS — Z23 Encounter for immunization: Secondary | ICD-10-CM

## 2023-10-18 DIAGNOSIS — Z1231 Encounter for screening mammogram for malignant neoplasm of breast: Secondary | ICD-10-CM

## 2023-10-18 DIAGNOSIS — M542 Cervicalgia: Secondary | ICD-10-CM

## 2023-10-18 LAB — POCT URINALYSIS DIP (CLINITEK)
Bilirubin, UA: NEGATIVE
Blood, UA: NEGATIVE
Glucose, UA: NEGATIVE mg/dL
Ketones, POC UA: NEGATIVE mg/dL
Leukocytes, UA: NEGATIVE
Nitrite, UA: NEGATIVE
POC PROTEIN,UA: NEGATIVE
Spec Grav, UA: 1.01 (ref 1.010–1.025)
Urobilinogen, UA: 0.2 U/dL
pH, UA: 6 (ref 5.0–8.0)

## 2023-10-18 NOTE — Progress Notes (Signed)
 Subjective:  Patient ID: Joanna [name], female    DOB: Aug 20, 1955  Age: 68 y.o. MRN: [id number]  Chief Complaint  Patient presents with   Chronic med management       Hypertension  Hyperlipidemia    Pt presents for follow up of hypertension. The patient is tolerating the medication well without side effects. Compliance with treatment has been good; including taking medication as directed , maintains a healthy diet and regular exercise regimen , and following up as directed. Currently taking zestoretic  20/25 mg qd  BP today 124/70 She denies chest pain or shortness of breath  Pt with history of vit D def - she actually stopped medication thinking with normal value last labwork she was not supposed to continue med Will check labwork today  Pt with history of anxiety - she is doing well on lexapro  20mg  qd and xanax  as needed  Pt with history of osteoarthritis - using lodine  200mg  Pt is having some tightness and soreness in neck and shoulders - no recent injury Denies upper extremity weakness or numbness  Pt with history of insomnia - stable on trazodone  150mg  qd  Pt with history of hyperlipidemia - currently taking lipitor 40mg  qd and due for labwork  Pt would like flu shot today  Pt due for mammogram (right breast) and would like to schedule Pt has history of left breast cancer in 2012 with left mastectomy Has not followed with oncology since 7/23  Current Outpatient Medications on File Prior to Visit  Medication Sig Dispense Refill   ALPRAZolam  (XANAX ) 0.5 MG tablet TAKE 1 TABLET BY MOUTH DAILY AS NEEDED FOR ANXIETY. 30 tablet 1   aspirin 81 MG EC tablet Take by mouth.     atorvastatin  (LIPITOR) 40 MG tablet Take 1 tablet (40 mg total) by mouth daily. 90 tablet 1   cetirizine (ZYRTEC) 10 MG tablet Take 10 mg by mouth daily.     escitalopram  (LEXAPRO ) 20 MG tablet TAKE ONE BY MOUTH DAILY 60 tablet 2   etodolac  (LODINE ) 200 MG capsule TAKE 1 CAPSULE BY MOUTH TWICE DAILY  60 capsule 1   lisinopril -hydrochlorothiazide  (ZESTORETIC ) 20-25 MG tablet TAKE ONE TABLET BY MOUTH DAILY 90 tablet 1   traZODone  (DESYREL ) 150 MG tablet TAKE 1 TABLET BY MOUTH AT BEDTIME 120 tablet 1   Vitamin D , Ergocalciferol , (DRISDOL ) 1.25 MG (50000 UNIT) CAPS capsule Take 1 capsule (50,000 Units total) by mouth 2 (two) times a week. 24 capsule 1   No current facility-administered medications on file prior to visit.   Past Medical History:  Diagnosis Date   Angina    related to stress   Arthritis    fingers   Cancer (HCC) 07/2010   left breast   Difficult intubation    short neck   GERD (gastroesophageal reflux disease)    H/O hiatal hernia    Headache(784.0)    History of migraines 01/26/11   it's been several years since my last one   Hypercholesteremia    Hypertension    echo done 2012  [place] [facility] in [place]   PONV (postoperative nausea and vomiting)    Recurrent upper respiratory infection (URI)    sinus infection-finishing up antibiotic 01/24/11   Sleep apnea ~ 2009   sleep study done ; [place]   Past Surgical History:  Procedure Laterality Date   ABDOMINAL HYSTERECTOMY  1999   APPENDECTOMY  1999   BREAST REDUCTION SURGERY  01/25/11   right breast reduced & lifted  BREAST REDUCTION SURGERY  01/25/2011   Procedure: BREAST REDUCTION WITH LIPOSUCTION;  Surgeon: Alm CHRISTELLA Sick;  Location: [facility] OR;  Service: Plastics;  Laterality: Right;  Right Breast Reduction    BREAST SURGERY  08/2010   left mastectomy-patient still has drainage tube in   BREAST SURGERY  01/25/11   right reduction & lift   BUNIONECTOMY  ~ 2009   left   CARPAL TUNNEL RELEASE  1981-1984   bilaterally   CHOLECYSTECTOMY     HAMMER TOE SURGERY  ~ 2009   left   LATISSIMUS FLAP TO BREAST  12/13/2010   Procedure: LATISSIMUS FLAP TO BREAST;  Surgeon: Alm CHRISTELLA Sick;  Location: [facility] OR;  Service: Plastics;  Laterality: Left;  LEFT LATISSIMUS FLAP WITH IMPLANT    History reviewed. No  pertinent family history. Social History   Socioeconomic History   Marital status: Married    Spouse name: Not on file   Number of children: Not on file   Years of education: Not on file   Highest education level: Not on file  Occupational History   Not on file  Tobacco Use   Smoking status: Never   Smokeless tobacco: Never  Substance and Sexual Activity   Alcohol use: Yes    Comment: occ.; haven't drank anything since 01/2010 (01/26/11)   Drug use: No   Sexual activity: Yes  Other Topics Concern   Not on file  Social History Narrative   Not on file   Social Drivers of Health   Financial Resource Strain: Low Risk  (10/18/2023)   Overall Financial Resource Strain (CARDIA)    Difficulty of Paying Living Expenses: Not hard at all  Food Insecurity: No Food Insecurity (10/18/2023)   Hunger Vital Sign    Worried About Running Out of Food in the Last Year: Never true    Ran Out of Food in the Last Year: Never true  Transportation Needs: No Transportation Needs (10/18/2023)   PRAPARE - Administrator, Civil Service (Medical): No    Lack of Transportation (Non-Medical): No  Physical Activity: Insufficiently Active (10/18/2023)   Exercise Vital Sign    Days of Exercise per Week: 2 days    Minutes of Exercise per Session: 30 min  Stress: No Stress Concern Present (10/18/2023)   [employer] of Occupational Health - Occupational Stress Questionnaire    Feeling of Stress: Not at all  Social Connections: Moderately Integrated (10/18/2023)   Social Connection and Isolation Panel    Frequency of Communication with Friends and Family: More than three times a week    Frequency of Social Gatherings with Friends and Family: More than three times a week    Attends Religious Services: 1 to 4 times per year    Active Member of [employer] or Organizations: No    Attends Banker Meetings: Never    Marital Status: Married   CONSTITUTIONAL: Negative for chills, fatigue,  fever, unintentional weight gain and unintentional weight loss.  E/N/T: Negative for ear pain, nasal congestion and sore throat.  CARDIOVASCULAR: Negative for chest pain, dizziness, palpitations and pedal edema.  RESPIRATORY: Negative for recent cough and dyspnea.  GASTROINTESTINAL: Negative for abdominal pain, acid reflux symptoms, constipation, diarrhea, nausea and vomiting.  MSK: see HPI INTEGUMENTARY: Negative for rash.  NEUROLOGICAL: Negative for dizziness and headaches.  PSYCHIATRIC: Negative for sleep disturbance and to question depression screen.  Negative for depression, negative for anhedonia.       Objective:  PHYSICAL EXAM:  VS: BP 124/70   Pulse 75   Temp 97.8 F (36.6 C) (Temporal)   Resp 18   Ht 5' 0.5 (1.537 m)   Wt 275 lb 12.8 oz (125.1 kg)   SpO2 96%   BMI 52.98 kg/m   GEN: Well nourished, well developed, in no acute distress  - obese Cardiac: RRR; no murmurs, rubs, or gallops,no edema -  Respiratory:  normal respiratory rate and pattern with no distress - normal breath sounds with no rales, rhonchi, wheezes or rubs MS: no deformity or atrophy - tender to neck and upper shoulders - tightness noted in muscles Skin: warm and dry, no rash  Neuro:  Alert and Oriented x 3,  - CN II-Xii grossly intact Psych: euthymic mood, appropriate affect and demeanor  Office Visit on 10/18/2023  Component Date Value Ref Range Status   Color, UA 10/18/2023 yellow  yellow Final   Clarity, UA 10/18/2023 clear  clear Final   Glucose, UA 10/18/2023 negative  negative mg/dL Final   Bilirubin, UA 90/81/7974 negative  negative Final   Ketones, POC UA 10/18/2023 negative  negative mg/dL Final   Spec Grav, UA 90/81/7974 1.010  1.010 - 1.025 Final   Blood, UA 10/18/2023 negative  negative Final   pH, UA 10/18/2023 6.0  5.0 - 8.0 Final   POC PROTEIN,UA 10/18/2023 negative  negative, trace Final   Urobilinogen, UA 10/18/2023 0.2  0.2 or 1.0 E.U./dL Final   Nitrite, UA 90/81/7974  Negative  Negative Final   Leukocytes, UA 10/18/2023 Negative  Negative Final    Lab Results  Component Value Date   WBC 6.9 04/16/2023   HGB 12.8 04/16/2023   HCT 38.6 04/16/2023   PLT 274 04/16/2023   GLUCOSE 80 04/16/2023   CHOL 297 (H) 04/16/2023   TRIG 112 04/16/2023   HDL 62 04/16/2023   LDLCALC 215 (H) 04/16/2023   ALT 17 04/16/2023   AST 18 04/16/2023   NA 141 04/16/2023   K 3.8 04/16/2023   CL 99 04/16/2023   CREATININE 0.72 04/16/2023   BUN 24 04/16/2023   CO2 25 04/16/2023   TSH 0.705 04/16/2023   HGBA1C 5.4 04/16/2023      Assessment & Plan:   Problem List Items Addressed This Visit       Cardiovascular and Mediastinum   Benign hypertension - Primary -    Relevant Orders   CBC with Differential/Platelet   Comprehensive metabolic panel   TSH UA Continue current meds     Other   Anxiety Take meds as directed    Mixed hyperlipidemia   Relevant Orders   Lipid panel Watch diet Restart lipitor 40mg  qd   Other Visit Diagnoses     Vitamin D  insufficiency       Relevant Orders   VITAMIN D  25 Hydroxy (Vit-D Deficiency, Fractures)  Hyperglycemia Hgb A1c pending Watch diet  Neck pain Continue lodine  Alternate heat/ice Rom exercises given  Morbid obesity (HCC) Watch diet and increase physical activity  History of breast cancer Encounter for breast cancer screening Mammogram right breast ordered  Need flu shot Fluzone HI given                    .  No orders of the defined types were placed in this encounter.   Orders Placed This Encounter  Procedures   MM 3D SCREENING MAMMOGRAM UNILATERAL RIGHT BREAST   Flu vaccine HIGH DOSE PF(Fluzone Trivalent)   CBC with Differential/Platelet   Comprehensive metabolic  panel with GFR   TSH   Lipid panel   Hemoglobin A1c   VITAMIN D  25 Hydroxy (Vit-D Deficiency, Fractures)   POCT URINALYSIS DIP (CLINITEK)     Follow-up: Return in about 6 months (around 04/16/2024) for chronic fasting  follow-up.  An After Visit Summary was printed and given to the patient.  CAMIE JONELLE NICHOLAUS DEVONNA [facility] [phone]

## 2023-10-19 ENCOUNTER — Other Ambulatory Visit: Payer: Self-pay | Admitting: Physician Assistant

## 2023-10-19 ENCOUNTER — Ambulatory Visit: Payer: Self-pay | Admitting: Physician Assistant

## 2023-10-19 DIAGNOSIS — E782 Mixed hyperlipidemia: Secondary | ICD-10-CM

## 2023-10-19 DIAGNOSIS — E559 Vitamin D deficiency, unspecified: Secondary | ICD-10-CM

## 2023-10-19 LAB — CBC WITH DIFFERENTIAL/PLATELET
Basophils Absolute: 0.1 x10E3/uL (ref 0.0–0.2)
Basos: 1 %
EOS (ABSOLUTE): 0.3 x10E3/uL (ref 0.0–0.4)
Eos: 4 %
Hematocrit: 40.2 % (ref 34.0–46.6)
Hemoglobin: 13.1 g/dL (ref 11.1–15.9)
Immature Grans (Abs): 0 x10E3/uL (ref 0.0–0.1)
Immature Granulocytes: 0 %
Lymphocytes Absolute: 2 x10E3/uL (ref 0.7–3.1)
Lymphs: 25 %
MCH: 29.6 pg (ref 26.6–33.0)
MCHC: 32.6 g/dL (ref 31.5–35.7)
MCV: 91 fL (ref 79–97)
Monocytes Absolute: 0.5 x10E3/uL (ref 0.1–0.9)
Monocytes: 7 %
Neutrophils Absolute: 5.1 x10E3/uL (ref 1.4–7.0)
Neutrophils: 63 %
Platelets: 280 x10E3/uL (ref 150–450)
RBC: 4.42 x10E6/uL (ref 3.77–5.28)
RDW: 12.9 % (ref 11.7–15.4)
WBC: 8 x10E3/uL (ref 3.4–10.8)

## 2023-10-19 LAB — COMPREHENSIVE METABOLIC PANEL WITH GFR
ALT: 14 IU/L (ref 0–32)
AST: 14 IU/L (ref 0–40)
Albumin: 4.5 g/dL (ref 3.9–4.9)
Alkaline Phosphatase: 114 IU/L (ref 49–135)
BUN/Creatinine Ratio: 26 (ref 12–28)
BUN: 22 mg/dL (ref 8–27)
Bilirubin Total: 0.5 mg/dL (ref 0.0–1.2)
CO2: 23 mmol/L (ref 20–29)
Calcium: 10 mg/dL (ref 8.7–10.3)
Chloride: 100 mmol/L (ref 96–106)
Creatinine, Ser: 0.84 mg/dL (ref 0.57–1.00)
Globulin, Total: 2.3 g/dL (ref 1.5–4.5)
Glucose: 86 mg/dL (ref 70–99)
Potassium: 4.3 mmol/L (ref 3.5–5.2)
Sodium: 139 mmol/L (ref 134–144)
Total Protein: 6.8 g/dL (ref 6.0–8.5)
eGFR: 77 mL/min/1.73 (ref >59)

## 2023-10-19 LAB — TSH: TSH: 0.77 u[IU]/mL (ref 0.450–4.500)

## 2023-10-19 LAB — LIPID PANEL
Chol/HDL Ratio: 5.8 ratio — ABNORMAL HIGH (ref 0.0–4.4)
Cholesterol, Total: 297 mg/dL — ABNORMAL HIGH (ref 100–199)
HDL: 51 mg/dL (ref >39)
LDL Chol Calc (NIH): 219 mg/dL — ABNORMAL HIGH (ref 0–99)
Triglycerides: 143 mg/dL (ref 0–149)
VLDL Cholesterol Cal: 27 mg/dL (ref 5–40)

## 2023-10-19 LAB — HEMOGLOBIN A1C
Est. average glucose Bld gHb Est-mCnc: 105 mg/dL
Hgb A1c MFr Bld: 5.3 % (ref 4.8–5.6)

## 2023-10-19 LAB — VITAMIN D 25 HYDROXY (VIT D DEFICIENCY, FRACTURES): Vit D, 25-Hydroxy: 26.5 ng/mL — ABNORMAL LOW (ref 30.0–100.0)

## 2023-10-19 MED ORDER — ATORVASTATIN CALCIUM 80 MG PO TABS: 80.0000 mg | ORAL_TABLET | Freq: Every day | ORAL | 1 refills | Status: AC

## 2023-10-19 MED ORDER — VITAMIN D (ERGOCALCIFEROL) 1.25 MG (50000 UNIT) PO CAPS: 50000.0000 [IU] | ORAL_CAPSULE | ORAL | 5 refills | Status: AC

## 2023-10-30 ENCOUNTER — Other Ambulatory Visit: Payer: Self-pay | Admitting: Physician Assistant

## 2023-10-30 DIAGNOSIS — F419 Anxiety disorder, unspecified: Secondary | ICD-10-CM

## 2023-10-31 DIAGNOSIS — K219 Gastro-esophageal reflux disease without esophagitis: Secondary | ICD-10-CM | POA: Diagnosis not present

## 2023-11-15 ENCOUNTER — Inpatient Hospital Stay (HOSPITAL_BASED_OUTPATIENT_CLINIC_OR_DEPARTMENT_OTHER): Admission: RE | Admit: 2023-11-15 | Source: Ambulatory Visit | Admitting: Radiology

## 2023-11-22 ENCOUNTER — Other Ambulatory Visit: Payer: Self-pay | Admitting: Physician Assistant

## 2023-11-22 DIAGNOSIS — I10 Essential (primary) hypertension: Secondary | ICD-10-CM

## 2023-11-28 ENCOUNTER — Ambulatory Visit (HOSPITAL_BASED_OUTPATIENT_CLINIC_OR_DEPARTMENT_OTHER): Admitting: Radiology

## 2023-11-29 ENCOUNTER — Ambulatory Visit (HOSPITAL_BASED_OUTPATIENT_CLINIC_OR_DEPARTMENT_OTHER): Admitting: Radiology

## 2023-12-03 ENCOUNTER — Ambulatory Visit (HOSPITAL_BASED_OUTPATIENT_CLINIC_OR_DEPARTMENT_OTHER)
Admission: RE | Admit: 2023-12-03 | Discharge: 2023-12-03 | Disposition: A | Source: Ambulatory Visit | Attending: Physician Assistant | Admitting: Physician Assistant

## 2023-12-03 DIAGNOSIS — Z1231 Encounter for screening mammogram for malignant neoplasm of breast: Secondary | ICD-10-CM | POA: Diagnosis not present

## 2023-12-05 ENCOUNTER — Other Ambulatory Visit: Payer: Self-pay | Admitting: Physician Assistant

## 2023-12-05 DIAGNOSIS — Z880 Allergy status to penicillin: Secondary | ICD-10-CM | POA: Diagnosis not present

## 2023-12-05 DIAGNOSIS — K5730 Diverticulosis of large intestine without perforation or abscess without bleeding: Secondary | ICD-10-CM | POA: Diagnosis not present

## 2023-12-05 DIAGNOSIS — F419 Anxiety disorder, unspecified: Secondary | ICD-10-CM

## 2023-12-05 DIAGNOSIS — E785 Hyperlipidemia, unspecified: Secondary | ICD-10-CM | POA: Diagnosis not present

## 2023-12-05 DIAGNOSIS — R1310 Dysphagia, unspecified: Secondary | ICD-10-CM | POA: Diagnosis not present

## 2023-12-05 DIAGNOSIS — I10 Essential (primary) hypertension: Secondary | ICD-10-CM | POA: Diagnosis not present

## 2023-12-05 DIAGNOSIS — Z1211 Encounter for screening for malignant neoplasm of colon: Secondary | ICD-10-CM | POA: Diagnosis not present

## 2023-12-05 DIAGNOSIS — Z853 Personal history of malignant neoplasm of breast: Secondary | ICD-10-CM | POA: Diagnosis not present

## 2023-12-05 DIAGNOSIS — Z888 Allergy status to other drugs, medicaments and biological substances status: Secondary | ICD-10-CM | POA: Diagnosis not present

## 2023-12-05 DIAGNOSIS — G4733 Obstructive sleep apnea (adult) (pediatric): Secondary | ICD-10-CM | POA: Diagnosis not present

## 2023-12-05 DIAGNOSIS — Z7982 Long term (current) use of aspirin: Secondary | ICD-10-CM | POA: Diagnosis not present

## 2023-12-05 DIAGNOSIS — K222 Esophageal obstruction: Secondary | ICD-10-CM | POA: Diagnosis not present

## 2023-12-05 DIAGNOSIS — Z79899 Other long term (current) drug therapy: Secondary | ICD-10-CM | POA: Diagnosis not present

## 2023-12-05 DIAGNOSIS — K449 Diaphragmatic hernia without obstruction or gangrene: Secondary | ICD-10-CM | POA: Diagnosis not present

## 2024-02-01 ENCOUNTER — Other Ambulatory Visit: Payer: Self-pay | Admitting: Physician Assistant

## 2024-02-15 ENCOUNTER — Other Ambulatory Visit: Payer: Self-pay | Admitting: Physician Assistant

## 2024-02-15 DIAGNOSIS — F419 Anxiety disorder, unspecified: Secondary | ICD-10-CM

## 2024-04-18 ENCOUNTER — Ambulatory Visit: Admitting: Physician Assistant
